# Patient Record
Sex: Male | Born: 1950 | Race: White | Hispanic: No | Smoking: Former smoker
Health system: Southern US, Community
[De-identification: ages and names within clinical notes are randomized; demographics above are authoritative.]

## PROBLEM LIST (undated history)

## (undated) DIAGNOSIS — I10 Essential (primary) hypertension: Secondary | ICD-10-CM

## (undated) DIAGNOSIS — I219 Acute myocardial infarction, unspecified: Secondary | ICD-10-CM

## (undated) DIAGNOSIS — E785 Hyperlipidemia, unspecified: Secondary | ICD-10-CM

## (undated) HISTORY — DX: Acute myocardial infarction, unspecified: I21.9

## (undated) HISTORY — DX: Essential (primary) hypertension: I10

## (undated) HISTORY — DX: Hyperlipidemia, unspecified: E78.5

---

## 2005-11-05 HISTORY — PX: OTHER SURGICAL HISTORY: SHX169

## 2006-11-05 HISTORY — PX: CORONARY STENT PLACEMENT: SHX1402

## 2007-11-06 HISTORY — PX: BACK SURGERY: SHX140

## 2009-07-27 ENCOUNTER — Ambulatory Visit: Payer: Self-pay | Admitting: Family Medicine

## 2010-03-21 ENCOUNTER — Ambulatory Visit: Payer: Self-pay | Admitting: Gastroenterology

## 2011-01-03 ENCOUNTER — Inpatient Hospital Stay: Payer: Self-pay | Admitting: Internal Medicine

## 2011-03-09 ENCOUNTER — Ambulatory Visit: Payer: Self-pay | Admitting: Internal Medicine

## 2012-07-21 ENCOUNTER — Ambulatory Visit: Payer: Self-pay | Admitting: Family Medicine

## 2013-05-11 ENCOUNTER — Ambulatory Visit: Payer: Self-pay | Admitting: Gastroenterology

## 2013-05-12 LAB — PATHOLOGY REPORT

## 2015-05-17 ENCOUNTER — Ambulatory Visit: Payer: Medicare Other | Attending: Pain Medicine | Admitting: Pain Medicine

## 2015-05-17 ENCOUNTER — Encounter: Payer: Self-pay | Admitting: Pain Medicine

## 2015-05-17 VITALS — BP 118/65 | HR 66 | Temp 97.5°F | Resp 16 | Ht 72.0 in | Wt 210.0 lb

## 2015-05-17 DIAGNOSIS — M79604 Pain in right leg: Secondary | ICD-10-CM | POA: Diagnosis present

## 2015-05-17 DIAGNOSIS — M5136 Other intervertebral disc degeneration, lumbar region: Secondary | ICD-10-CM | POA: Diagnosis not present

## 2015-05-17 DIAGNOSIS — M545 Low back pain: Secondary | ICD-10-CM | POA: Diagnosis present

## 2015-05-17 DIAGNOSIS — Z9889 Other specified postprocedural states: Secondary | ICD-10-CM | POA: Insufficient documentation

## 2015-05-17 DIAGNOSIS — M47816 Spondylosis without myelopathy or radiculopathy, lumbar region: Secondary | ICD-10-CM | POA: Insufficient documentation

## 2015-05-17 DIAGNOSIS — M79605 Pain in left leg: Secondary | ICD-10-CM | POA: Diagnosis present

## 2015-05-17 DIAGNOSIS — M533 Sacrococcygeal disorders, not elsewhere classified: Secondary | ICD-10-CM | POA: Insufficient documentation

## 2015-05-17 NOTE — Progress Notes (Signed)
Subjective:    Patient ID: Troy Woodward, male    DOB: 13-Jul-1951, 64 y.o.   MRN: [id number]  HPI patient is 64 year old gentleman who comes to pain management Center at the request of Dr. Joen Laura for further evaluation and treatment of pain involving the lower back and lower extremity regions. Patient is with history of lower back and lower extremity pain and has undergone prior surgical intervention of the lumbar region in [place] years ago. Patient states that his pain has been fairly well-controlled with present treatment regimen. An states that the pain does interfere with activities of daily living the significant degree at times. Patient states that his pain increases with walking and working. Patient states the pain is associated with weakness and interferes with patient's ability to obtain restful sleep. Regional pain involving the region of the upper back region and denies any trauma change in events of daily living the call significant change in symptomatology. We discussed patient's overall condition informed patient that we would review additional records and pending follow-up evaluation may consider patient for interventional treatment as well as modifications of other aspects of patient's treatment regimen including modification of medications. The patient was understanding and in agreement with suggested treatment plan.   Review of Systems Cardiovascular Prior heart attack  Pulmonary Unremarkable  Neurological Unremarkable  Psychological   unremarkable  Gastrointestinal Unremarkable  Genitourinary Unremarkable  Hematological Unremarkable  Endocrine Unremarkable  Rheumatological Unremarkable  Musculoskeletal Unremarkable  Other significant Unremarkable            Objective:   Physical Exam  There was tends to palpation of the splenius capitis and occipitalis musculature region of mild degree with moderate tends to palpation of the trapezius  musculature region on the right compared to the left. There was mild tenderness over the cervical facet cervical paraspinal musculature region and mild tenderness to palpation of the cervical facet cervical paraspinal musculature region on the right compared to the left. Palpation over the thoracic facet thoracic paraspinal musculature region was with tinged palpation as well with no crepitus of the thoracic region noted. Patient appeared to be with bilaterally equal grip strength. Tinel and Phalen's maneuver were without increase of pain of significant degree. There was tends to palpation over the thoracic facet thoracic paraspinal musculature region with no crepitus of the thoracic region noted. Palpation over the lumbar paraspinal musculature region lumbar facet region associated with increased pain with lateral bending and rotation and extension and palpation over the lumbar facets. There was tennis of the PSIS and PII S region. Palpation of the PSIS and PII S region with most significant on the right compared to the left. Straight leg raising was tolerates approximately 20 without a definite increase of pain with dorsiflexion noted. There was negative clonus negative Homans. DTRs were difficult to elicit patient had difficulty relaxing. Mild tenderness of the greater trochanteric region iliotibial band region. There was negative clonus negative Homans. Abdomen soft nontender with no costovertebral angle tenderness noted.      Assessment & Plan:   Degenerative disc disease lumbar spine Status post surgical intervention lumbar region  Lumbar facet syndrome  Sacroiliac joint dysfunction    Plan   Continue present medications. Patient will continue present medications at this time. We will request permission from Dr. Quillian Quince to prescribed medications for treatment of patient's pain. Pending review of additional records and decision of Dr. Quillian Quince we may begin prescribing medications for treatment of  patient's pain as explained to patient  on today's visit  F/U PCP Dr. Quillian QuinceBliss for evaliation of  BP and general medical  condition.  F/U surgical evaluation. We've discussed neurosurgical evaluation and will consider as explained to patient on today's visit  F/U neurological evaluation  MRI, CT scan, PNCV/EMG studies and other studies have been discussed and may be considered pending further assessment of patient's condition  May consider radiofrequency rhizolysis or intraspinal procedures pending response to present treatment and F/U evaluation.  Patient to call Pain Management Center should patient have concerns prior to scheduled return appointment.

## 2015-05-17 NOTE — Patient Instructions (Addendum)
Continue present medications. We will request permission from Dr. Quillian QuinceBliss to prescribed medications for treatment of your pain   F/U PCP for evaliation of  BP and general medical  condition.  F/U surgical evaluation  F/U Dr. Juliann Paresallwood as discussed  F/U neurological evaluation  May consider radiofrequency rhizolysis or intraspinal procedures pending response to present treatment and F/U evaluation.  Patient to call Pain Management Center should patient have concerns prior to scheduled return appointment.

## 2015-05-17 NOTE — Progress Notes (Signed)
Safety precautions to be maintained throughout the outpatient stay will include: orient to surroundings, keep bed in low position, maintain call bell within reach at all times, provide assistance with transfer out of bed and ambulation.  

## 2015-06-02 ENCOUNTER — Other Ambulatory Visit: Payer: Self-pay | Admitting: Pain Medicine

## 2015-06-14 ENCOUNTER — Encounter: Payer: Self-pay | Admitting: Pain Medicine

## 2015-06-14 ENCOUNTER — Ambulatory Visit: Payer: Medicare Other | Attending: Pain Medicine | Admitting: Pain Medicine

## 2015-06-14 VITALS — BP 111/71 | HR 69 | Temp 97.5°F | Resp 16 | Ht 72.0 in | Wt 215.0 lb

## 2015-06-14 DIAGNOSIS — M47816 Spondylosis without myelopathy or radiculopathy, lumbar region: Secondary | ICD-10-CM

## 2015-06-14 DIAGNOSIS — Z9889 Other specified postprocedural states: Secondary | ICD-10-CM | POA: Diagnosis not present

## 2015-06-14 DIAGNOSIS — M533 Sacrococcygeal disorders, not elsewhere classified: Secondary | ICD-10-CM | POA: Diagnosis not present

## 2015-06-14 DIAGNOSIS — M79605 Pain in left leg: Secondary | ICD-10-CM | POA: Diagnosis present

## 2015-06-14 DIAGNOSIS — M545 Low back pain: Secondary | ICD-10-CM | POA: Diagnosis present

## 2015-06-14 DIAGNOSIS — M5136 Other intervertebral disc degeneration, lumbar region: Secondary | ICD-10-CM | POA: Insufficient documentation

## 2015-06-14 DIAGNOSIS — M79604 Pain in right leg: Secondary | ICD-10-CM | POA: Diagnosis present

## 2015-06-14 NOTE — Patient Instructions (Addendum)
Continue present medications for now.  Lumbar epidural steroid injection to be performed at time of return appointment if Dr. Quillian Quince will allow you to stop your Plavix therapy prior to the lumbar epidural steroid injection  F/U PCP Dr. Quillian Quince for evaliation of  BP and general medical  Condition  X-ray of lumbar spine. Please ask Morrie Sheldon what day this week you will have x-ray of your lumbar spine  F/U surgical evaluation  F/U neurological evaluation  May consider radiofrequency rhizolysis or intraspinal procedures pending response to present treatment and F/U evaluation   Patient to call Pain Management Center should patient have concerns prior to scheduled return appointmen.  Stop Aspirin and Plavix 4 days before your procedure. Epidural Steroid Injection Patient Information  Description: The epidural space surrounds the nerves as they exit the spinal cord.  In some patients, the nerves can be compressed and inflamed by a bulging disc or a tight spinal canal (spinal stenosis).  By injecting steroids into the epidural space, we can bring irritated nerves into direct contact with a potentially helpful medication.  These steroids act directly on the irritated nerves and can reduce swelling and inflammation which often leads to decreased pain.  Epidural steroids may be injected anywhere along the spine and from the neck to the low back depending upon the location of your pain.   After numbing the skin with local anesthetic (like Novocaine), a small needle is passed into the epidural space slowly.  You may experience a sensation of pressure while this is being done.  The entire block usually last less than 10 minutes.  Conditions which may be treated by epidural steroids:   Low back and leg pain  Neck and arm pain  Spinal stenosis  Post-laminectomy syndrome  Herpes zoster (shingles) pain  Pain from compression fractures  Preparation for the injection:  1. Do not eat any solid food or  dairy products within 6 hours of your appointment.  2. You may drink clear liquids up to 2 hours before appointment.  Clear liquids include water, black coffee, juice or soda.  No milk or cream please. 3. You may take your regular medication, including pain medications, with a sip of water before your appointment  Diabetics should hold regular insulin (if taken separately) and take 1/2 normal NPH dos the morning of the procedure.  Carry some sugar containing items with you to your appointment. 4. A driver must accompany you and be prepared to drive you home after your procedure.  5. Bring all your current medications with your. 6. An IV may be inserted and sedation may be given at the discretion of the physician.   7. A blood pressure cuff, EKG and other monitors will often be applied during the procedure.  Some patients may need to have extra oxygen administered for a short period. 8. You will be asked to provide medical information, including your allergies, prior to the procedure.  We must know immediately if you are taking blood thinners (like Coumadin/Warfarin)  Or if you are allergic to IV iodine contrast (dye). We must know if you could possible be pregnant.  Possible side-effects:  Bleeding from needle site  Infection (rare, may require surgery)  Nerve injury (rare)  Numbness & tingling (temporary)  Difficulty urinating (rare, temporary)  Spinal headache ( a headache worse with upright posture)  Light -headedness (temporary)  Pain at injection site (several days)  Decreased blood pressure (temporary)  Weakness in arm/leg (temporary)  Pressure sensation in back/neck (temporary)  Call if you experience:  Fever/chills associated with headache or increased back/neck pain.  Headache worsened by an upright position.  New onset weakness or numbness of an extremity below the injection site  Hives or difficulty breathing (go to the emergency room)  Inflammation or drainage  at the infection site  Severe back/neck pain  Any new symptoms which are concerning to you  Please note:  Although the local anesthetic injected can often make your back or neck feel good for several hours after the injection, the pain will likely return.  It takes 3-7 days for steroids to work in the epidural space.  You may not notice any pain relief for at least that one week.  If effective, we will often do a series of three injections spaced 3-6 weeks apart to maximally decrease your pain.  After the initial series, we generally will wait several months before considering a repeat injection of the same type.  If you have any questions, please call [phone] [facility]

## 2015-06-14 NOTE — Progress Notes (Signed)
   Subjective:    Patient ID: Troy Woodward, male    DOB: 04-17-1951, 64 y.o.   MRN: [id number]  HPI  Patient is 64 year old gentleman who returns to pain management for further evaluation and treatment of pain involving the lower back and lower extremity regions. The patient is with prior surgical intervention of the lumbar region. Patient notes of return of pain involving the lower back and lower extremity regions which is aggravated by excessive standing and walking. Patient's pain radiating from the lumbar region to the lower extremity regions. We discussed additional treatment including interventional treatment and will consider patient for interventional treatment at time return appointment which time we will consider patient for lumbar epidural steroid injection. Patient denies any recent trauma or change in events of daily living the call significant change in symptomatology. We'll proceed with interventional treatment in attempt to decrease severity of patient's symptoms, minimize progression of symptoms, and avoid the need for more involved treatment. The patient was understanding and in agreement with suggested treatment plan.    Review of Systems     Objective:   Physical Exam  There was tends to palpation of the splenius capitis and occipitalis musculature regions. Palpation of these regions reproduced mild discomfort. There was tenderness over the cervical facet cervical paraspinal musculature region as well as the thoracic facet thoracic paraspinal musculature region of mild degree. There appeared to be unremarkable Spurling's maneuver. Tinel and Phalen's maneuver were without increase of pain of significant degree. Palpation over the region of the thoracic paraspinal muscles reproduced facet region was a tends to palpation of moderate degree and moderately severe degree of the lower thoracic region with evidence of moderately severe muscle spasm. Extension and palpation of the lumbar  facets reproduce moderately severe discomfort. Lateral bending and rotation associated with moderately severe discomfort. Straight leg raising limited to approximately 20 without increased pain with dorsiflexion noted. Reece. No definite sensory deficit of dermatomal distribution was detected. There was negative clonus negative Homans. Abdomen was nontender and no costovertebral angle tenderness was noted      Assessment & Plan:    Degenerative disc disease lumbar spine Status post surgical intervention lumbar region  Lumbar facet syndrome  Sacroiliac joint dysfunction    Plan   Continue present medications  Lumbar epidural steroid injection to be performed at time return appointment  F/U PCP Dr. Quillian Quince for evaliation of  BP and general medical  condition  F/U surgical evaluation as discussed   F/U neurological evaluation  X-ray of the lumbar spine to evaluate for degenerative changes of the lumbar spine and other abnormalities which may be contributing to patient's symptomatology  May consider radiofrequency rhizolysis or intraspinal procedures pending response to present treatment and F/U evaluation   Patient to call Pain Management Center should patient have concerns prior to scheduled return appointmen.

## 2015-06-15 ENCOUNTER — Ambulatory Visit
Admission: RE | Admit: 2015-06-15 | Discharge: 2015-06-15 | Disposition: A | Discharge status: Home / Self Care | Payer: Medicare Other | Source: Ambulatory Visit | Attending: Pain Medicine | Admitting: Pain Medicine

## 2015-06-15 DIAGNOSIS — Z9889 Other specified postprocedural states: Secondary | ICD-10-CM

## 2015-06-15 DIAGNOSIS — M533 Sacrococcygeal disorders, not elsewhere classified: Secondary | ICD-10-CM | POA: Insufficient documentation

## 2015-06-15 DIAGNOSIS — M5136 Other intervertebral disc degeneration, lumbar region: Secondary | ICD-10-CM

## 2015-06-15 DIAGNOSIS — Z981 Arthrodesis status: Secondary | ICD-10-CM | POA: Diagnosis not present

## 2015-06-15 DIAGNOSIS — M545 Low back pain: Secondary | ICD-10-CM | POA: Diagnosis present

## 2015-06-15 DIAGNOSIS — M47816 Spondylosis without myelopathy or radiculopathy, lumbar region: Secondary | ICD-10-CM

## 2015-06-20 ENCOUNTER — Ambulatory Visit: Payer: Medicare Other | Admitting: Pain Medicine

## 2015-06-20 ENCOUNTER — Other Ambulatory Visit
Admission: RE | Admit: 2015-06-20 | Discharge: 2015-06-20 | Disposition: A | Discharge status: Home / Self Care | Payer: Medicare Other | Source: Ambulatory Visit | Attending: Pain Medicine | Admitting: Pain Medicine

## 2015-06-20 VITALS — BP 111/86 | HR 59 | Temp 97.8°F | Resp 18 | Ht 72.0 in | Wt 212.0 lb

## 2015-06-20 DIAGNOSIS — M47816 Spondylosis without myelopathy or radiculopathy, lumbar region: Secondary | ICD-10-CM

## 2015-06-20 DIAGNOSIS — Z9889 Other specified postprocedural states: Secondary | ICD-10-CM | POA: Insufficient documentation

## 2015-06-20 DIAGNOSIS — M5136 Other intervertebral disc degeneration, lumbar region: Secondary | ICD-10-CM

## 2015-06-20 DIAGNOSIS — Z7902 Long term (current) use of antithrombotics/antiplatelets: Secondary | ICD-10-CM

## 2015-06-20 DIAGNOSIS — M533 Sacrococcygeal disorders, not elsewhere classified: Secondary | ICD-10-CM

## 2015-06-20 DIAGNOSIS — Z7982 Long term (current) use of aspirin: Secondary | ICD-10-CM

## 2015-06-20 LAB — APTT: APTT: 27 s (ref 24–36)

## 2015-06-20 LAB — PROTIME-INR
INR: 1.05
PROTHROMBIN TIME: 13.9 s (ref 11.4–15.0)

## 2015-06-20 MED ORDER — CEFUROXIME AXETIL 250 MG PO TABS: 250.0000 mg | ORAL_TABLET | Freq: Two times a day (BID) | ORAL | Status: DC

## 2015-06-20 MED ORDER — LIDOCAINE HCL (PF) 1 % IJ SOLN
INTRAMUSCULAR | Status: AC
  Administered 2015-06-20: 13:00:00
  Filled 2015-06-20: qty 5

## 2015-06-20 MED ORDER — SODIUM CHLORIDE 0.9 % IJ SOLN
INTRAMUSCULAR | Status: AC
  Administered 2015-06-20: 13:00:00
  Filled 2015-06-20: qty 20

## 2015-06-20 MED ORDER — TRIAMCINOLONE ACETONIDE 40 MG/ML IJ SUSP
INTRAMUSCULAR | Status: AC
  Administered 2015-06-20: 13:00:00
  Filled 2015-06-20: qty 1

## 2015-06-20 MED ORDER — CEFAZOLIN SODIUM 1 G IJ SOLR
INTRAMUSCULAR | Status: AC
  Administered 2015-06-20: 1 g via INTRAVENOUS
  Filled 2015-06-20: qty 10

## 2015-06-20 NOTE — Progress Notes (Signed)
Safety precautions to be maintained throughout the outpatient stay will include: orient to surroundings, keep bed in low position, maintain call bell within reach at all times, provide assistance with transfer out of bed and ambulation.  

## 2015-06-20 NOTE — Patient Instructions (Addendum)
Continue present medications and begin taking antibiotic Ceftin as prescribed. Please obtain your antibiotic today and begin taking antibiotic today  F/U PCP Dr. Clemmie Krill for evaliation of  BP and general medical  condition.  F/U surgical evaluation as discussed  F/U neurological evaluation.  May consider radiofrequency rhizolysis or intraspinal procedures pending response to present treatment and F/U evaluation.  Patient to call [facility] should patient have concerns prior to scheduled return appointment.  Pain Management Discharge Instructions  General Discharge Instructions :  If you need to reach your doctor call: Monday-Friday 8:00 am - 4:00 pm at [phone] or toll free [phone].  After clinic hours [phone] to have operator reach doctor.  Bring all of your medication bottles to all your appointments in the pain clinic.  To cancel or reschedule your appointment with Pain Management please remember to call 24 hours in advance to avoid a fee.  Refer to the educational materials which you have been given on: General Risks, I had my Procedure. Discharge Instructions, Post Sedation.  Post Procedure Instructions:  The drugs you were given will stay in your system until tomorrow, so for the next 24 hours you should not drive, make any legal decisions or drink any alcoholic beverages.  You may eat anything you prefer, but it is better to start with liquids then soups and crackers, and gradually work up to solid foods.  Please notify your doctor immediately if you have any unusual bleeding, trouble breathing or pain that is not related to your normal pain.  Depending on the type of procedure that was done, some parts of your body may feel week and/or numb.  This usually clears up by tonight or the next day.  Walk with the use of an assistive device or accompanied by an adult for the 24 hours.  You may use ice on the affected area for the first 24 hours.  Put ice  in a Ziploc bag and cover with a towel and place against area 15 minutes on 15 minutes off.  You may switch to heat after 24 hours.GENERAL RISKS AND COMPLICATIONS  What are the risk, side effects and possible complications? Generally speaking, most procedures are safe.  However, with any procedure there are risks, side effects, and the possibility of complications.  The risks and complications are dependent upon the sites that are lesioned, or the type of nerve block to be performed.  The closer the procedure is to the spine, the more serious the risks are.  Great care is taken when placing the radio frequency needles, block needles or lesioning probes, but sometimes complications can occur. 1. Infection: Any time there is an injection through the skin, there is a risk of infection.  This is why sterile conditions are used for these blocks.  There are four possible types of infection. 1. Localized skin infection. 2. Central Nervous System Infection-This can be in the form of Meningitis, which can be deadly. 3. Epidural Infections-This can be in the form of an epidural abscess, which can cause pressure inside of the spine, causing compression of the spinal cord with subsequent paralysis. This would require an emergency surgery to decompress, and there are no guarantees that the patient would recover from the paralysis. 4. Discitis-This is an infection of the intervertebral discs.  It occurs in about 1% of discography procedures.  It is difficult to treat and it may lead to surgery.        2. Pain: the needles have to go  through skin and soft tissues, will cause soreness.       3. Damage to internal structures:  The nerves to be lesioned may be near blood vessels or    other nerves which can be potentially damaged.       4. Bleeding: Bleeding is more common if the patient is taking blood thinners such as  aspirin, Coumadin, Ticiid, Plavix, etc., or if he/she have some genetic predisposition  such as  hemophilia. Bleeding into the spinal canal can cause compression of the spinal  cord with subsequent paralysis.  This would require an emergency surgery to  decompress and there are no guarantees that the patient would recover from the  paralysis.       5. Pneumothorax:  Puncturing of a lung is a possibility, every time a needle is introduced in  the area of the chest or upper back.  Pneumothorax refers to free air around the  collapsed lung(s), inside of the thoracic cavity (chest cavity).  Another two possible  complications related to a similar event would include: Hemothorax and Chylothorax.   These are variations of the Pneumothorax, where instead of air around the collapsed  lung(s), you may have blood or chyle, respectively.       6. Spinal headaches: They may occur with any procedures in the area of the spine.       7. Persistent CSF (Cerebro-Spinal Fluid) leakage: This is a rare problem, but may occur  with prolonged intrathecal or epidural catheters either due to the formation of a fistulous  track or a dural tear.       8. Nerve damage: By working so close to the spinal cord, there is always a possibility of  nerve damage, which could be as serious as a permanent spinal cord injury with  paralysis.       9. Death:  Although rare, severe deadly allergic reactions known as "Anaphylactic  reaction" can occur to any of the medications used.      10. Worsening of the symptoms:  We can always make thing worse.  What are the chances of something like this happening? Chances of any of this occuring are extremely low.  By statistics, you have more of a chance of getting killed in a motor vehicle accident: while driving to the hospital than any of the above occurring .  Nevertheless, you should be aware that they are possibilities.  In general, it is similar to taking a shower.  Everybody knows that you can slip, hit your head and get killed.  Does that mean that you should not shower again?  Nevertheless  always keep in mind that statistics do not mean anything if you happen to be on the wrong side of them.  Even if a procedure has a 1 (one) in a 1,000,000 (million) chance of going wrong, it you happen to be that one..Also, keep in mind that by statistics, you have more of a chance of having something go wrong when taking medications.  Who should not have this procedure? If you are on a blood thinning medication (e.g. Coumadin, Plavix, see list of "Blood Thinners"), or if you have an active infection going on, you should not have the procedure.  If you are taking any blood thinners, please inform your physician.  How should I prepare for this procedure?  Do not eat or drink anything at least six hours prior to the procedure.  Bring a driver with you .  It cannot be  a taxi.  Come accompanied by an adult that can drive you back, and that is strong enough to help you if your legs get weak or numb from the local anesthetic.  Take all of your medicines the morning of the procedure with just enough water to swallow them.  If you have diabetes, make sure that you are scheduled to have your procedure done first thing in the morning, whenever possible.  If you have diabetes, take only half of your insulin dose and notify our nurse that you have done so as soon as you arrive at the clinic.  If you are diabetic, but only take blood sugar pills (oral hypoglycemic), then do not take them on the morning of your procedure.  You may take them after you have had the procedure.  Do not take aspirin or any aspirin-containing medications, at least eleven (11) days prior to the procedure.  They may prolong bleeding.  Wear loose fitting clothing that may be easy to take off and that you would not mind if it got stained with Betadine or blood.  Do not wear any jewelry or perfume  Remove any nail coloring.  It will interfere with some of our monitoring equipment.  NOTE: Remember that this is not meant to be  interpreted as a complete list of all possible complications.  Unforeseen problems may occur.  BLOOD THINNERS The following drugs contain aspirin or other products, which can cause increased bleeding during surgery and should not be taken for 2 weeks prior to and 1 week after surgery.  If you should need take something for relief of minor pain, you may take acetaminophen which is found in Tylenol,m Datril, Anacin-3 and Panadol. It is not blood thinner. The products listed below are.  Do not take any of the products listed below in addition to any listed on your instruction sheet.  A.P.C or A.P.C with Codeine Codeine Phosphate Capsules #3 Ibuprofen Ridaura  ABC compound Congesprin Imuran rimadil  Advil Cope Indocin Robaxisal  Alka-Seltzer Effervescent Pain Reliever and Antacid Coricidin or Coricidin-D  Indomethacin Rufen  Alka-Seltzer plus Cold Medicine Cosprin Ketoprofen S-A-C Tablets  Anacin Analgesic Tablets or Capsules Coumadin Korlgesic Salflex  Anacin Extra Strength Analgesic tablets or capsules CP-2 Tablets Lanoril Salicylate  Anaprox Cuprimine Capsules Levenox Salocol  Anexsia-D Dalteparin Magan Salsalate  Anodynos Darvon compound Magnesium Salicylate Sine-off  Ansaid Dasin Capsules Magsal Sodium Salicylate  Anturane Depen Capsules Marnal Soma  APF Arthritis pain formula Dewitt's Pills Measurin Stanback  Argesic Dia-Gesic Meclofenamic Sulfinpyrazone  Arthritis Bayer Timed Release Aspirin Diclofenac Meclomen Sulindac  Arthritis pain formula Anacin Dicumarol Medipren Supac  Analgesic (Safety coated) Arthralgen Diffunasal Mefanamic Suprofen  Arthritis Strength Bufferin Dihydrocodeine Mepro Compound Suprol  Arthropan liquid Dopirydamole Methcarbomol with Aspirin Synalgos  ASA tablets/Enseals Disalcid Micrainin Tagament  Ascriptin Doan's Midol Talwin  Ascriptin A/D Dolene Mobidin Tanderil  Ascriptin Extra Strength Dolobid Moblgesic Ticlid  Ascriptin with Codeine Doloprin or Doloprin  with Codeine Momentum Tolectin  Asperbuf Duoprin Mono-gesic Trendar  Aspergum Duradyne Motrin or Motrin IB Triminicin  Aspirin plain, buffered or enteric coated Durasal Myochrisine Trigesic  Aspirin Suppositories Easprin Nalfon Trillsate  Aspirin with Codeine Ecotrin Regular or Extra Strength Naprosyn Uracel  Atromid-S Efficin Naproxen Ursinus  Auranofin Capsules Elmiron Neocylate Vanquish  Axotal Emagrin Norgesic Verin  Azathioprine Empirin or Empirin with Codeine Normiflo Vitamin E  Azolid Emprazil Nuprin Voltaren  Bayer Aspirin plain, buffered or children's or timed BC Tablets or powders Encaprin Orgaran Warfarin Sodium  Buff-a-Comp Enoxaparin  Orudis Zorpin  Buff-a-Comp with Codeine Equegesic Os-Cal-Gesic   Buffaprin Excedrin plain, buffered or Extra Strength Oxalid   Bufferin Arthritis Strength Feldene Oxphenbutazone   Bufferin plain or Extra Strength Feldene Capsules Oxycodone with Aspirin   Bufferin with Codeine Fenoprofen Fenoprofen Pabalate or Pabalate-SF   Buffets II Flogesic Panagesic   Buffinol plain or Extra Strength Florinal or Florinal with Codeine Panwarfarin   Buf-Tabs Flurbiprofen Penicillamine   Butalbital Compound Four-way cold tablets Penicillin   Butazolidin Fragmin Pepto-Bismol   Carbenicillin Geminisyn Percodan   Carna Arthritis Reliever Geopen Persantine   Carprofen Gold's salt Persistin   Chloramphenicol Goody's Phenylbutazone   Chloromycetin Haltrain Piroxlcam   Clmetidine heparin Plaquenil   Cllnoril Hyco-pap Ponstel   Clofibrate Hydroxy chloroquine Propoxyphen         Before stopping any of these medications, be sure to consult the physician who ordered them.  Some, such as Coumadin (Warfarin) are ordered to prevent or treat serious conditions such as "deep thrombosis", "pumonary embolisms", and other heart problems.  The amount of time that you may need off of the medication may also vary with the medication and the reason for which you were taking it.   If you are taking any of these medications, please make sure you notify your pain physician before you undergo any procedures.         Oxygen per nasal cannula 1-5- liters per minute until oxygen saturation is >90% on room air without respiratory distress. Diet as tolerated. IV: Discontinue when stable. Activity: As tolerated. Discharge when criteria are met.  May drive home

## 2015-06-20 NOTE — Progress Notes (Signed)
   Subjective:    Patient ID: Troy Woodward, male    DOB: [birth year], 64 y.o.   MRN: [id number]  HPI    Review of Systems     Objective:   Physical Exam        Assessment & Plan:  .p

## 2015-06-20 NOTE — Progress Notes (Signed)
   Subjective:    Patient ID: Troy Woodward, male    DOB: [birth year], 64 y.o.   MRN: [id number]  HPI  PROCEDURE PERFORMED: Lumbar epidural steroid injection   NOTE: The patient is a 64 y.o. male who returns to [facility] for further evaluation and treatment of pain involving the lumbar and lower extremity region.   Prior studies revealed the patient to be with degenerative disc disease lumbar spine status post surgical intervention lumbar region. The risks, benefits, and expectations of the procedure have been discussed and explained to the patient who was understanding and in agreement with suggested treatment plan. We will proceed with lumbar epidural steroid injection as discussed and as explained to the patient who is willing to proceed with procedure as planned.   DESCRIPTION OF PROCEDURE: Lumbar epidural steroid injection with  EKG,, blood pressure, pulse, and pulse oximetry monitoring. The procedure was performed with the patient in the prone position under fluoroscopic guidance. A local anesthetic skin wheal of 1.5% plain lidocaine was accomplished at proposed entry site. An 18-gauge Tuohy epidural needle was inserted at the L  1 vertebral body level  right of the midline via loss-of-resistance technique with negative heme and negative CSF return. A total of 4 mL of Preservative-Free normal saline with 40 mg of Kenalog injected incrementally via epidurally placed needle. Needle was removed.    A total of 40 mg of Kenalog was utilized for the procedure.   The patient tolerated the injection well.    PLAN:   1. Medications: We will continue presently prescribed medications. 2. Will consider modification of treatment regimen pending response to treatment rendered on today's visit and follow-up evaluation. 3. The patient is to follow-up with primary care physician Dr. Quillian Quince regarding blood pressure and general medical condition status post lumbar epidural steroid injection  performed on today's visit. 4. Surgical evaluation has been addressed 5. Neurological evaluation to be considered as discussed 6. The patient may be a candidate for radiofrequency procedures, implantation device, and other treatment pending response to treatment and follow-up evaluation. 7. The patient has been advised to adhere to proper body mechanics and avoid activities which appear to aggravate condition. 8. The patient has been advised to call the Pain Management Center prior to scheduled return appointment should there be significant change in condition or should there be sign  The patient is understanding and agrees with the suggested  treatment plan   Review of Systems     Objective:   Physical Exam        Assessment & Plan:

## 2015-06-21 ENCOUNTER — Telehealth: Payer: Self-pay | Admitting: *Deleted

## 2015-06-21 NOTE — Telephone Encounter (Signed)
Left voice mail

## 2015-07-19 ENCOUNTER — Ambulatory Visit: Payer: Medicare Other | Attending: Pain Medicine | Admitting: Pain Medicine

## 2015-07-19 ENCOUNTER — Encounter: Payer: Self-pay | Admitting: Pain Medicine

## 2015-07-19 VITALS — BP 126/78 | HR 96 | Temp 97.6°F | Resp 18 | Ht 72.0 in | Wt 215.0 lb

## 2015-07-19 DIAGNOSIS — M5481 Occipital neuralgia: Secondary | ICD-10-CM

## 2015-07-19 DIAGNOSIS — M5030 Other cervical disc degeneration, unspecified cervical region: Secondary | ICD-10-CM | POA: Diagnosis not present

## 2015-07-19 DIAGNOSIS — Z9889 Other specified postprocedural states: Secondary | ICD-10-CM | POA: Diagnosis not present

## 2015-07-19 DIAGNOSIS — R51 Headache: Secondary | ICD-10-CM | POA: Diagnosis present

## 2015-07-19 DIAGNOSIS — M5136 Other intervertebral disc degeneration, lumbar region: Secondary | ICD-10-CM

## 2015-07-19 DIAGNOSIS — M47816 Spondylosis without myelopathy or radiculopathy, lumbar region: Secondary | ICD-10-CM

## 2015-07-19 DIAGNOSIS — M47812 Spondylosis without myelopathy or radiculopathy, cervical region: Secondary | ICD-10-CM | POA: Insufficient documentation

## 2015-07-19 DIAGNOSIS — M542 Cervicalgia: Secondary | ICD-10-CM | POA: Diagnosis present

## 2015-07-19 DIAGNOSIS — M79602 Pain in left arm: Secondary | ICD-10-CM | POA: Diagnosis present

## 2015-07-19 DIAGNOSIS — M533 Sacrococcygeal disorders, not elsewhere classified: Secondary | ICD-10-CM | POA: Insufficient documentation

## 2015-07-19 MED ORDER — HYDROCODONE-ACETAMINOPHEN 5-325 MG PO TABS: ORAL_TABLET | ORAL | Status: DC

## 2015-07-19 NOTE — Progress Notes (Signed)
Spoke with Belenda Cruise from Dr. Aggie Cosier office. Last prescribed-- Hydrocodone 5/325 mg 1 three times per day. #90 on 06-21-15 Meloxicam 15 mg, one per day, #30 on 06-21-15

## 2015-07-19 NOTE — Progress Notes (Signed)
   Subjective:    Patient ID: Troy Woodward, male    DOB: 05-19-1951, 64 y.o.   MRN: [id number]  HPI  Patient is 64 year old gentleman returns to pain management for further evaluation and treatment of pain involving the neck headaches and upper extremity region as well as the lower back and lower extremity region. Patient's has significant improvement of the lower back lower extremity pain following interventional treatment performed in [facility]. Patient states present time he has pain occurring in the region of the neck associated with stiffness and spasms of the neck. Patient denies trauma change in events of daily living the cause change in symptomatology. We discussed patient's condition we will proceed with greater occipital nerve block at time return appointment in attempt to decrease headaches as well as decreased pain of the neck and surrounding muscle matures spasms. Patient was understanding and agreed suggested treatment plan. We will also refill patient's hydrocodone acetaminophen after we verified patient's medication dosage with Dr. Quillian Quince on today's visit. The patient was in agreement with suggested treatment plan.    Review of Systems     Objective:   Physical Exam There was tennis over the splenius capitate and occipitalis region of moderate degree. There was moderate tennis over the cervical facet cervical paraspinal musculature region. There appeared to be unremarkable Spurling's maneuver. Patient appeared to be with slightly decreased grip strength. Tinel and Phalen's maneuver were without increase of pain significantly. Palpation of the acromioclavicular glenohumeral joint region reproduces moderate discomfort. There was tenderness over the thoracic facet thoracic paraspinal musculature region without crepitus of the thoracic region noted. Palpation of the splenius capitis and occipitalis musculature region reproduced predominant portion of patient's pain there was  tenderness over the lumbar facet lumbar paraspinal muscles region of mild degree. Lateral bending and rotation and extension to palpation of the lumbar facets reproduce mild to mild to moderate discomfort. There was tenderness over the PSIS PII S region as well as the gluteal and piriformis musculature region of mild to moderate degree. There was mild tinnitus of the greater trochanteric region iliotibial band region. Straight leg raising was tolerated to possibly 20 without increased pain with dorsiflexion noted. There appeared to be negative clonus negative Homans. No definite sensory deficit of dermatomal distribution detected. DTRs difficult to elicit patient had difficulty relaxing. Abdomen nontender no costovertebral tenderness noted.      Assessment & Plan:    Degenerative disc disease cervical spine  Bilateral occipital neuralgia  Cervical facet syndrome  Degenerative disc disease lumbar spine Status post surgical intervention lumbar region  Lumbar facet syndrome  Sacroiliac joint dysfunction    PLAN   Continue present medication. Patient was given prescription for hydrocodone acetaminophen  5/325. Patient is limit 3 pills per day. Patient was given quantity of 90 pills  Greater occipital nerve block to be performed at time return appointment  F/U PCP Dr. Quillian Quince for evaliation of  BP and general medical  condition  F/U surgical evaluation. May consider pending follow-up evaluations  F/U neurological evaluation. May consider pending follow-up evaluations  May consider radiofrequency rhizolysis or intraspinal procedures pending response to present treatment and F/U evaluation   Patient to call Pain Management Center should patient have concerns prior to scheduled return appointment.

## 2015-07-19 NOTE — Patient Instructions (Addendum)
PLAN   Continue present medication   Greater occipital nerve block to be performed at time return appointment  F/U PCP Dr. Quillian Quince for evaliation of  BP and general medical  condition  F/U surgical evaluation. May consider pending follow-up evaluations  F/U neurological evaluation. May consider pending follow-up evaluations  May consider radiofrequency rhizolysis or intraspinal procedures pending response to present treatment and F/U evaluation   Patient to call Pain Management Center should patient have concerns prior to scheduled return appointment. Occipital Nerve Block Patient Information  Description: The occipital nerves originate in the cervical (neck) spinal cord and travel upward through muscle and tissue to supply sensation to the back of the head and top of the scalp.  In addition, the nerves control some of the muscles of the scalp.  Occipital neuralgia is an irritation of these nerves which can cause headaches, numbness of the scalp, and neck discomfort.     The occipital nerve block will interrupt nerve transmission through these nerves and can relieve pain and spasm.  The block consists of insertion of a small needle under the skin in the back of the head to deposit local anesthetic (numbing medicine) and/or steroids around the nerve.  The entire block usually lasts less than 5 minutes.  Conditions which may be treated by occipital blocks:   Muscular pain and spasm of the scalp  Nerve irritation, back of the head  Headaches  Upper neck pain  Preparation for the injection:  1. Do not eat any solid food or dairy products within 6 hours of your appointment. 2. You may drink clear liquids up to 2 hours before appointment.  Clear liquids include water, black coffee, juice or soda.  No milk or cream please. 3. You may take your regular medication, including pain medications, with a sip of water before you appointment.  Diabetics should hold regular insulin (if taken  separately) and take 1/2 normal NPH dose the morning of the procedure.  Carry some sugar containing items with you to your appointment. 4. A driver must accompany you and be prepared to drive you home after your procedure. 5. Bring all your current medications with you. 6. An IV may be inserted and sedation may be given at the discretion of the physician. 7. A blood pressure cuff, EKG, and other monitors will often be applied during the procedure.  Some patients may need to have extra oxygen administered for a short period. 8. You will be asked to provide medical information, including your allergies and medications, prior to the procedure.  We must know immediately if you are taking blood thinners (like Coumadin/Warfarin) or if you are allergic to IV iodine contrast (dye).  We must know if you could possible be pregnant.  9. Do not wear a high collared shirt or turtleneck.  Tie long hair up in the back if possible.  Possible side-effects:   Bleeding from needle site  Infection (rare, may require surgery)  Nerve injury (rare)  Hair on back of neck can be tinged with iodine scrub (this will wash out)  Light-headedness (temporary)  Pain at injection site (several days)  Decreased blood pressure (rare, temporary)  Seizure (very rare)  Call if you experience:   Hives or difficulty breathing ( go to the emergency room)  Inflammation or drainage at the injection site(s)  Please note:  Although the local anesthetic injected can often make your painful muscles or headache feel good for several hours after the injection, the pain may return.  It takes 3-7 days for steroids to work.  You may not notice any pain relief for at least one week.  If effective, we will often do a series of injections spaced 3-6 weeks apart to maximally decrease your pain.  If you have any questions, please call [phone] [facility]

## 2015-07-19 NOTE — Progress Notes (Signed)
.  pms3.

## 2015-08-01 ENCOUNTER — Ambulatory Visit: Payer: Medicare Other | Attending: Pain Medicine | Admitting: Pain Medicine

## 2015-08-01 ENCOUNTER — Encounter: Payer: Self-pay | Admitting: Pain Medicine

## 2015-08-01 VITALS — BP 114/62 | HR 53 | Temp 98.7°F | Resp 16 | Ht 72.0 in | Wt 215.0 lb

## 2015-08-01 DIAGNOSIS — M542 Cervicalgia: Secondary | ICD-10-CM | POA: Insufficient documentation

## 2015-08-01 DIAGNOSIS — R51 Headache: Secondary | ICD-10-CM | POA: Diagnosis present

## 2015-08-01 DIAGNOSIS — M5481 Occipital neuralgia: Secondary | ICD-10-CM

## 2015-08-01 DIAGNOSIS — M47816 Spondylosis without myelopathy or radiculopathy, lumbar region: Secondary | ICD-10-CM

## 2015-08-01 DIAGNOSIS — M5030 Other cervical disc degeneration, unspecified cervical region: Secondary | ICD-10-CM

## 2015-08-01 DIAGNOSIS — M47812 Spondylosis without myelopathy or radiculopathy, cervical region: Secondary | ICD-10-CM

## 2015-08-01 DIAGNOSIS — M533 Sacrococcygeal disorders, not elsewhere classified: Secondary | ICD-10-CM

## 2015-08-01 DIAGNOSIS — M5136 Other intervertebral disc degeneration, lumbar region: Secondary | ICD-10-CM

## 2015-08-01 DIAGNOSIS — Z9889 Other specified postprocedural states: Secondary | ICD-10-CM

## 2015-08-01 MED ORDER — ORPHENADRINE CITRATE 30 MG/ML IJ SOLN
INTRAMUSCULAR | Status: AC
  Filled 2015-08-01: qty 2

## 2015-08-01 MED ORDER — BUPIVACAINE HCL (PF) 0.25 % IJ SOLN
INTRAMUSCULAR | Status: AC
  Administered 2015-08-01: 11:00:00
  Filled 2015-08-01: qty 30

## 2015-08-01 MED ORDER — TRIAMCINOLONE ACETONIDE 40 MG/ML IJ SUSP
INTRAMUSCULAR | Status: AC
  Administered 2015-08-01: 11:00:00
  Filled 2015-08-01: qty 1

## 2015-08-01 NOTE — Progress Notes (Signed)
premenstrual tension syndrome  

## 2015-08-01 NOTE — Progress Notes (Signed)
Safety precautions to be maintained throughout the outpatient stay will include: orient to surroundings, keep bed in low position, maintain call bell within reach at all times, provide assistance with transfer out of bed and ambulation.  

## 2015-08-01 NOTE — Progress Notes (Signed)
   Subjective:    Patient ID: Troy Woodward, male    DOB: 02/01/1951, 64 y.o.   MRN: [id number]  HPI  NOTE: The patient is a 64 y.o.-year-old male who returns to the [facility] for further evaluation and treatment of pain consisting of pain involving the region of the neck and headache.  Patient is with history of pain involving the neck with stiffness of the neck and headaches. There is concern regarding significant component of patient's headaches due to greater occipital neuralgia myofascial pain related headaches .  The risks, benefits, and expectations of the procedure have been discussed and explained to patient, who is understanding and wishes to proceed with interventional treatment as discussed and as explained to patient.  Will proceed with greater occipital nerve blocks with myoneural block injections at this time as discussed and as explained to patient.  All are understanding and in agreement with suggested treatment plan.    PROCEDURE:  Greater occipital nerve block on the left side with EKG, blood pressure, pulse, pulse oximetry monitoring.  Procedure performed with patient in prone position.  Greater occipital nerve block on the left side.   With patient in prone position, Betadine prep of proposed entry site accomplished.  Following identification of the nuchal ridge, 22 -gauge needle was inserted at the level of the nuchal ridge medial to the occipital artery.  Following negative aspiration, 4cc 0.25% bupivacaine with Kenalog injected for left greater occipital nerve block.  Needle was removed.  Patient tolerated injection well.   Greater occipital nerve block on the rightt side. The greater occipital nerve block on the right side was performed exactly as the left greater occipital nerve block was performed and utilizing the same technique.  Myoneural block injections of the cervical musculature region Following alcohol prep of proposed entry site a 22-gauge needle was  inserted in the cervical musculature region and following negative aspiration 2 cc of 0.25% bupivacaine with Norflex was injected for myoneural block injections 4   A total of 10 mg Kenalog was utilized for the entire procedure.  PLAN:    1. Medications: Will continue presently prescribed medication hydrocodone acetaminophen at this time. 2. Patient to follow up with primary care physician Dr. Quillian Quince for evaluation of blood pressure and general medical condition status post procedure performed on today's visit. 3. Neurological evaluation for further assessment of headaches for further studies as discussed. 4. Surgical evaluation as discussed.  5. Patient may be candidate for Botox injections, radiofrequency procedures, as well as implantation type procedures pending response to treatment rendered on today's visit and pending follow-up evaluation. 6. Patient has been advised to adhere to proper body mechanics and to avoid activities which appear to aggravate condition.cations:  Will continue presently prescribed medications at this time. 7. The patient is understanding and in agreement with the suggested treatment plan.   Review of Systems     Objective:   Physical Exam        Assessment & Plan:

## 2015-08-01 NOTE — Patient Instructions (Addendum)
PLAN   Continue present medication hydrocodone acetaminophen   F/U PCP Dr. Quillian Quince for evaliation of  BP and general medical  condition  F/U surgical evaluation. May consider pending follow-up evaluations  F/U neurological evaluation. May consider pending follow-up evaluations  May consider radiofrequency rhizolysis or intraspinal procedures pending response to present treatment and F/U evaluation   Patient to call [facility] should patient have concerns prior to scheduled return appointment.  Pain Management Discharge Instructions  General Discharge Instructions :  If you need to reach your doctor call: Monday-Friday 8:00 am - 4:00 pm at [phone] or toll free [phone].  After clinic hours [phone] to have operator reach doctor.  Bring all of your medication bottles to all your appointments in the pain clinic.  To cancel or reschedule your appointment with Pain Management please remember to call 24 hours in advance to avoid a fee.  Refer to the educational materials which you have been given on: General Risks, I had my Procedure. Discharge Instructions, Post Sedation.  Post Procedure Instructions:  Please notify your doctor immediately if you have any unusual bleeding, trouble breathing or pain that is not related to your normal pain.  Depending on the type of procedure that was done, some parts of your body may feel week and/or numb.  This usually clears up by tonight or the next day.  Walk with the use of an assistive device or accompanied by an adult for the 24 hours.  You may use ice on the affected area for the first 24 hours.  Put ice in a Ziploc bag and cover with a towel and place against area 15 minutes on 15 minutes off.  You may switch to heat after 24 hours.

## 2015-08-02 ENCOUNTER — Telehealth: Payer: Self-pay | Admitting: *Deleted

## 2015-08-02 NOTE — Telephone Encounter (Signed)
Patient denies problems post procedure. 

## 2015-08-17 ENCOUNTER — Ambulatory Visit: Payer: Medicare Other | Attending: Pain Medicine | Admitting: Pain Medicine

## 2015-08-17 ENCOUNTER — Encounter: Payer: Self-pay | Admitting: Pain Medicine

## 2015-08-17 VITALS — BP 120/65 | HR 57 | Temp 97.7°F | Resp 15 | Ht 72.0 in | Wt 215.0 lb

## 2015-08-17 DIAGNOSIS — M542 Cervicalgia: Secondary | ICD-10-CM | POA: Diagnosis present

## 2015-08-17 DIAGNOSIS — Z9889 Other specified postprocedural states: Secondary | ICD-10-CM

## 2015-08-17 DIAGNOSIS — M5481 Occipital neuralgia: Secondary | ICD-10-CM | POA: Diagnosis not present

## 2015-08-17 DIAGNOSIS — M533 Sacrococcygeal disorders, not elsewhere classified: Secondary | ICD-10-CM

## 2015-08-17 DIAGNOSIS — M51369 Other intervertebral disc degeneration, lumbar region without mention of lumbar back pain or lower extremity pain: Secondary | ICD-10-CM

## 2015-08-17 DIAGNOSIS — M545 Low back pain: Secondary | ICD-10-CM | POA: Diagnosis present

## 2015-08-17 DIAGNOSIS — M5030 Other cervical disc degeneration, unspecified cervical region: Secondary | ICD-10-CM | POA: Diagnosis not present

## 2015-08-17 DIAGNOSIS — M47816 Spondylosis without myelopathy or radiculopathy, lumbar region: Secondary | ICD-10-CM

## 2015-08-17 DIAGNOSIS — M5136 Other intervertebral disc degeneration, lumbar region: Secondary | ICD-10-CM

## 2015-08-17 DIAGNOSIS — R51 Headache: Secondary | ICD-10-CM | POA: Diagnosis present

## 2015-08-17 DIAGNOSIS — M47812 Spondylosis without myelopathy or radiculopathy, cervical region: Secondary | ICD-10-CM

## 2015-08-17 MED ORDER — HYDROCODONE-ACETAMINOPHEN 5-325 MG PO TABS: ORAL_TABLET | ORAL | Status: DC

## 2015-08-17 MED ORDER — GABAPENTIN 300 MG PO CAPS
ORAL_CAPSULE | ORAL | Status: DC
Start: 2015-08-17 — End: 2015-09-15

## 2015-08-17 NOTE — Progress Notes (Signed)
   Subjective:    Patient ID: Troy Woodward, male    DOB: 03/21/1951, 64 y.o.   MRN: [id number]  HPI Patient is 64 year old gentleman who returns to [facility] for further evaluation and treatment of pain involving the neck upper extremity regions with headache as well as pain of the lower back and lower extremity region. The patient missed improvement of pain with prior treatment and [facility]. Patient states that he continues to benefit from prior interventional treatment performed in [facility]. We discussed patient's medications and will continue hydrocodone acetaminophen and will begin Neurontin. Cautioned patient regarding respiratory depression confusion excessive sedation and other side effects which can occur with Neurontin and hydrocodone acetaminophen. The patient acknowledged understanding and will exercise TO avoid undesirable side effects we will avoid interventional treatment at this time and hip response to hydrocodone acetaminophen and the addition of Neurontin to patient's treatment regimen      Review of Systems     Objective:   Physical Exam  There was tenderness of the splenius capitis Cipro talus region of mild degree. There was mild tenderness of the cervical facet cervical paraspinal musculature region. There was unremarkable Spurling's maneuver and patient appeared to be with bilaterally equal grip strength. Tinel and Phalen's maneuver were without increase of pain of significant degree. There was tends to palpation over the region of the cervical facet cervical paraspinal musculature and thoracic facet thoracic paraspinal muscles region a moderate degree. Palpation over the thoracic facet thoracic paraspinal musculature region was without evidence of crepitus of the thoracic region. Palpation over the lumbar paraspinal muscles lumbar facet region associated with moderate discomfort. Lateral bending and rotation and extension and  palpation of the lumbar facets reproduce moderate discomfort. There was moderate tenderness over the PSIS PII S region as well as the gluteal and piriformis musculature region. There was mild tenderness of the greater trochanteric region iliotibial band region straight leg raising tolerates approximately 20 without increased pain with dorsiflexion noted. EHL strength appeared to be equal with no sensory deficit of dermatomal distribution detectedThere was negative clonus negative Homans       Assessment & Plan:    Degenerative disc disease lumbar spine Status post surgical intervention lumbar region  Lumbar facet syndrome  Sacroiliac joint dysfunction  Bilateral occipital neuralgia  Degenerative disc disease cervical spine  Cervical facet syndrome   PLAN   Continue present medication hydrocodone acetaminophen and begin Neurontin . Patient was cautioned regarding respiratory depression confusion excessive sedation and other side effects which can occur with medications  F/U PCP Dr. Quillian QuinceBliss for evaliation of  BP and general medical  condition  F/U surgical evaluation. May consider pending follow-up evaluations  F/U neurological evaluation. May consider pending follow-up evaluations  May consider radiofrequency rhizolysis or intraspinal procedures pending response to present treatment and F/U evaluation   Patient to call [facility] Center should patient have concerns prior to scheduled return appointment.

## 2015-08-17 NOTE — Patient Instructions (Addendum)
PLAN   Continue present medication hydrocodone acetaminophen and begin Neurontin. Cautioned Neurontin can cause respiratory depression excessive sedation confusion and other side effects  F/U PCP Dr. Quillian QuinceBliss for evaliation of  BP and general medical  condition  F/U surgical evaluation. May consider pending follow-up evaluations  F/U neurological evaluation. May consider pending follow-up evaluations  May consider radiofrequency rhizolysis or intraspinal procedures pending response to present treatment and F/U evaluation   Patient to call [facility] should patient have concerns prior to scheduled return appointment.

## 2015-08-17 NOTE — Progress Notes (Signed)
Safety precautions to be maintained throughout the outpatient stay will include: orient to surroundings, keep bed in low position, maintain call bell within reach at all times, provide assistance with transfer out of bed and ambulation.  

## 2015-08-30 ENCOUNTER — Ambulatory Visit: Payer: Medicare Other | Admitting: Pain Medicine

## 2015-09-06 ENCOUNTER — Other Ambulatory Visit: Payer: Self-pay | Admitting: Pain Medicine

## 2015-09-15 ENCOUNTER — Encounter: Payer: Self-pay | Admitting: Pain Medicine

## 2015-09-15 ENCOUNTER — Ambulatory Visit: Payer: Medicare Other | Attending: Pain Medicine | Admitting: Pain Medicine

## 2015-09-15 VITALS — BP 118/87 | HR 61 | Temp 97.9°F | Resp 18 | Ht 72.0 in | Wt 215.0 lb

## 2015-09-15 DIAGNOSIS — M47816 Spondylosis without myelopathy or radiculopathy, lumbar region: Secondary | ICD-10-CM

## 2015-09-15 DIAGNOSIS — Z9889 Other specified postprocedural states: Secondary | ICD-10-CM | POA: Insufficient documentation

## 2015-09-15 DIAGNOSIS — M5481 Occipital neuralgia: Secondary | ICD-10-CM | POA: Insufficient documentation

## 2015-09-15 DIAGNOSIS — R51 Headache: Secondary | ICD-10-CM | POA: Diagnosis present

## 2015-09-15 DIAGNOSIS — M533 Sacrococcygeal disorders, not elsewhere classified: Secondary | ICD-10-CM | POA: Diagnosis not present

## 2015-09-15 DIAGNOSIS — M5136 Other intervertebral disc degeneration, lumbar region: Secondary | ICD-10-CM | POA: Diagnosis not present

## 2015-09-15 DIAGNOSIS — M5030 Other cervical disc degeneration, unspecified cervical region: Secondary | ICD-10-CM | POA: Diagnosis not present

## 2015-09-15 DIAGNOSIS — M542 Cervicalgia: Secondary | ICD-10-CM | POA: Diagnosis present

## 2015-09-15 DIAGNOSIS — M47812 Spondylosis without myelopathy or radiculopathy, cervical region: Secondary | ICD-10-CM

## 2015-09-15 MED ORDER — HYDROCODONE-ACETAMINOPHEN 5-325 MG PO TABS: ORAL_TABLET | ORAL | Status: DC

## 2015-09-15 MED ORDER — GABAPENTIN 300 MG PO CAPS: ORAL_CAPSULE | ORAL | Status: DC

## 2015-09-15 NOTE — Progress Notes (Signed)
Safety precautions to be maintained throughout the outpatient stay will include: orient to surroundings, keep bed in low position, maintain call bell within reach at all times, provide assistance with transfer out of bed and ambulation.  

## 2015-09-15 NOTE — Patient Instructions (Signed)
PLAN   Continue present medication hydrocodone acetaminophen and Neurontin.  F/U PCP Dr. Quillian QuinceBliss for evaliation of  BP and general medical  condition  F/U surgical evaluation. May consider pending follow-up evaluations  F/U neurological evaluation. May consider pending follow-up evaluations  May consider radiofrequency rhizolysis or intraspinal procedures pending response to present treatment and F/U evaluation   Patient to call [facility] should patient have concerns prior to scheduled return appointment.

## 2015-09-15 NOTE — Progress Notes (Signed)
   Subjective:    Patient ID: Troy Woodward, male    DOB: 04/15/1951, 64 y.o.   MRN: [id number]  HPI  The patient is a 64 year old gentleman who returns to pain management Center for further evaluation and treatment of pain involving the neck headaches upper extremities lower back and lower extremity region. Patient states the pain is fairly well-controlled at this time. The patient does admit to cramping of the legs at night. We have asked patient to follow up with Dr. Cresenciano LickPlace address his lower extremity cramping and to consider obtaining evaluation of his electrolytes and other laboratory studies. We have also discussed medications as well as other dietary supplements which may be beneficial in terms of reducing patient's leg cramping. This present time we will continue patient's Neurontin and hydrocodone acetaminophen which has helped significantly in terms of improving patient's pain of the lumbar lower extremity region and cervical epidural extremity region as well as headaches. The patient agreed to suggested treatment plan.       Review of Systems     Objective:   Physical Exam  There was mild tenderness to palpation over the splenius capitis thalamus musculature region. There is minimal tenderness of the acromioclavicular and glenohumeral joint regions. Patient appeared to be bilaterally equal grip strength and Tinel and Phalen's and had increased pain significantly. There was unremarkable Spurling's maneuver There was mild tenderness of the lumbar facet lumbar paraspinal musculature region. The patient tolerated to 30 without increased pain with dorsiflexion noted. No sensory deficit or dermatomal distribution at the lower extremities noted. Palpation over the lumbar facets lumbar paraspinal musculature reproduces mild discomfort. There is mild tenderness to palpation of the PSIS  And P IIS regions. No significant history palpation was noted in the region of the greater trochanteric region  on iliotibial band region that was negative clonus negative Homans. Abdomen nontender with no costovertebral length and is noted        Assessment & Plan:      Degenerative disc disease cervical spine  Bilateral occipital neuralgia  Cervical facet syndrome  Degenerative disc disease lumbar spine Status post surgical intervention lumbar region  Lumbar facet syndrome  Sacroiliac joint dysfunction    PLAN   Continue present medication hydrocodone acetaminophen and Neurontin.  F/U PCP Dr. Quillian QuinceBliss for evaliation of  BP and general medical  condition  F/U surgical evaluation. May consider pending follow-up evaluations  F/U neurological evaluation. May consider pending follow-up evaluations  May consider radiofrequency rhizolysis or intraspinal procedures pending response to present treatment and F/U evaluation   Patient to call [facility] Center should patient have concerns prior to scheduled return appointment.

## 2015-10-12 ENCOUNTER — Encounter: Payer: Self-pay | Admitting: Pain Medicine

## 2015-10-12 ENCOUNTER — Ambulatory Visit: Payer: Medicare Other | Attending: Pain Medicine | Admitting: Pain Medicine

## 2015-10-12 VITALS — BP 100/76 | HR 67 | Temp 98.1°F | Resp 18 | Ht 73.0 in | Wt 220.0 lb

## 2015-10-12 DIAGNOSIS — M47816 Spondylosis without myelopathy or radiculopathy, lumbar region: Secondary | ICD-10-CM

## 2015-10-12 DIAGNOSIS — M5481 Occipital neuralgia: Secondary | ICD-10-CM | POA: Diagnosis not present

## 2015-10-12 DIAGNOSIS — M5136 Other intervertebral disc degeneration, lumbar region: Secondary | ICD-10-CM | POA: Diagnosis not present

## 2015-10-12 DIAGNOSIS — M5030 Other cervical disc degeneration, unspecified cervical region: Secondary | ICD-10-CM | POA: Diagnosis present

## 2015-10-12 DIAGNOSIS — R51 Headache: Secondary | ICD-10-CM | POA: Diagnosis present

## 2015-10-12 DIAGNOSIS — M533 Sacrococcygeal disorders, not elsewhere classified: Secondary | ICD-10-CM

## 2015-10-12 DIAGNOSIS — Z9889 Other specified postprocedural states: Secondary | ICD-10-CM

## 2015-10-12 DIAGNOSIS — M47812 Spondylosis without myelopathy or radiculopathy, cervical region: Secondary | ICD-10-CM

## 2015-10-12 DIAGNOSIS — M51369 Other intervertebral disc degeneration, lumbar region without mention of lumbar back pain or lower extremity pain: Secondary | ICD-10-CM

## 2015-10-12 MED ORDER — GABAPENTIN 300 MG PO CAPS: ORAL_CAPSULE | ORAL | Status: DC

## 2015-10-12 MED ORDER — HYDROCODONE-ACETAMINOPHEN 5-325 MG PO TABS: ORAL_TABLET | ORAL | Status: DC

## 2015-10-12 NOTE — Patient Instructions (Addendum)
PLAN   Continue present medication hydrocodone acetaminophen and Neurontin.. May increase Neurontin by one more pill per day only if the Neurontin does not cause excessive drowsiness confusion respiratory depression or other side effects  F/U PCP Dr. Quillian QuinceBliss for evaliation of  BP and general medical  condition  F/U surgical evaluation. May consider pending follow-up evaluations  F/U neurological evaluation. May consider pending follow-up evaluations  May consider radiofrequency rhizolysis or intraspinal procedures pending response to present treatment and F/U evaluation   Patient to call [facility] should patient have concerns prior to scheduled return appointment.

## 2015-10-12 NOTE — Progress Notes (Signed)
Safety precautions to be maintained throughout the outpatient stay will include: orient to surroundings, keep bed in low position, maintain call bell within reach at all times, provide assistance with transfer out of bed and ambulation.  

## 2015-10-12 NOTE — Progress Notes (Signed)
   Subjective:    Patient ID: Troy Woodward, male    DOB: 11/02/1951, 64 y.o.   MRN: [id number]  HPI    Review of Systems     Objective:   Physical Exam        Assessment & Plan:

## 2015-10-12 NOTE — Progress Notes (Signed)
   Subjective:    Patient ID: Troy Woodward, male    DOB: 05/22/1951, 64 y.o.   MRN: [id number]  HPI  The patient is a 64 year old gentleman who returns to pain management for further evaluation and treatment of pain involving pain of the cervical region with pain radiating to the back of the hip precipitating headaches. The patient also has history of lumbar lower extremity pain. The patient states the pain is well-controlled at this time and patient is able to perform activities of daily living without significant pain interfering with activities of daily living. Patient is able to work and states that he is able to obtain restful sleep as well. We will continue Neurontin and hydrocodone acetaminophen as prescribed this time and remain available to consider additional interventional treatment pending response to treatment and follow-up evaluation. The patient agreed to suggested treatment plan.       Review of Systems     Objective:   Physical Exam  Since to palpation of the splenius capitis and occipitalis musculature regions palpation which reproduces pain of mild degree. There was mild tenderness to palpation over the region of the cervical facet cervical paraspinal musculature region as well as the thoracic facet thoracic paraspinal musculature region with no crepitus of the thoracic region noted. There was unremarkable Spurling's maneuver Palpation of the cervical region was evidence of muscle spasm of mild degree. Palpation over the lumbar paraspinal musculature region lumbar facet region was attends to palpation of mild to moderate degree with lateral bending rotation extension and palpation of the lumbar facets reproducing mild to moderate discomfort. Palpation over the PSIS and PII S region reproduced mild discomfort. There was mild tends to palpation of the greater trochanteric region and iliotibial band region. Straight leg raising was tolerates approximately 20 without increased  pain noted with dorsiflexion noted. It was negative clonus negative Homans. Abdomen was nontender with no costovertebral tenderness noted.     Assessment & Plan:  Degenerative disc disease lumbar spine Status post surgical intervention lumbar region  Lumbar facet syndrome  Sacroiliac joint dysfunction  Bilateral occipital neuralgia  Degenerative disc disease cervical spine  Cervical facet syndrome     PLAN  Continue present medication hydrocodone acetaminophen and Neurontin  F/U PCP Dr. Quillian QuinceBliss for evaliation of  BP and general medical  condition  F/U surgical evaluation. May consider pending follow-up evaluations  F/U neurological evaluation. May consider pending follow-up evaluations  May consider radiofrequency rhizolysis or intraspinal procedures pending response to present treatment and F/U evaluation   Patient to call [facility] Center should patient have concerns prior to scheduled return appointment.

## 2015-11-09 ENCOUNTER — Encounter: Payer: Self-pay | Admitting: Pain Medicine

## 2015-11-09 ENCOUNTER — Other Ambulatory Visit: Payer: Self-pay | Admitting: Pain Medicine

## 2015-11-09 ENCOUNTER — Ambulatory Visit: Payer: Medicare Other | Attending: Pain Medicine | Admitting: Pain Medicine

## 2015-11-09 VITALS — BP 97/60 | HR 71 | Temp 98.1°F | Resp 16 | Ht 72.0 in | Wt 220.0 lb

## 2015-11-09 DIAGNOSIS — Z9889 Other specified postprocedural states: Secondary | ICD-10-CM | POA: Insufficient documentation

## 2015-11-09 DIAGNOSIS — M47816 Spondylosis without myelopathy or radiculopathy, lumbar region: Secondary | ICD-10-CM

## 2015-11-09 DIAGNOSIS — M533 Sacrococcygeal disorders, not elsewhere classified: Secondary | ICD-10-CM | POA: Diagnosis not present

## 2015-11-09 DIAGNOSIS — M5136 Other intervertebral disc degeneration, lumbar region: Secondary | ICD-10-CM | POA: Diagnosis not present

## 2015-11-09 DIAGNOSIS — M5481 Occipital neuralgia: Secondary | ICD-10-CM

## 2015-11-09 DIAGNOSIS — R51 Headache: Secondary | ICD-10-CM | POA: Diagnosis present

## 2015-11-09 DIAGNOSIS — M5030 Other cervical disc degeneration, unspecified cervical region: Secondary | ICD-10-CM | POA: Insufficient documentation

## 2015-11-09 DIAGNOSIS — M47812 Spondylosis without myelopathy or radiculopathy, cervical region: Secondary | ICD-10-CM

## 2015-11-09 DIAGNOSIS — M542 Cervicalgia: Secondary | ICD-10-CM | POA: Diagnosis present

## 2015-11-09 DIAGNOSIS — M545 Low back pain: Secondary | ICD-10-CM | POA: Diagnosis present

## 2015-11-09 MED ORDER — GABAPENTIN 300 MG PO CAPS: ORAL_CAPSULE | ORAL | Status: DC

## 2015-11-09 MED ORDER — HYDROCODONE-ACETAMINOPHEN 5-325 MG PO TABS: ORAL_TABLET | ORAL | Status: DC

## 2015-11-09 NOTE — Patient Instructions (Signed)
PLAN   Continue present medication hydrocodone acetaminophen and Neurontin.  F/U PCP Dr. Bliss for evaliation of  BP and general medical  condition  F/U surgical evaluation. May consider pending follow-up evaluations  F/U neurological evaluation. May consider pending follow-up evaluations  May consider radiofrequency rhizolysis or intraspinal procedures pending response to present treatment and F/U evaluation   Patient to call [facility] should patient have concerns prior to scheduled return appointment. 

## 2015-11-09 NOTE — Progress Notes (Signed)
   Subjective:    Patient ID: Troy Woodward, male    DOB: 01/16/1951, 65 y.o.   MRN: [id number]  HPI  The patient is a 65 year old gentleman who returns to pain management Center for further evaluation and treatment of pain involving the neck headaches as well as pain of the lower back and lower extremity region. The patient states that he is tolerating hydrocodone acetaminophen and Neurontin fairly well. We discussed patient's medications and will continue hydrocodone acetaminophen and Neurontin as discussed. Patient is to avoid excessive sedation confusion and other undesirable side effects which can occur with these medications as we again readdressed to patient on today's visit. The patient denied any of these side effects. The patient denied significant pain interfering with activities of daily living and stated that he is able to perform activities of daily living as well as obtaining restful sleep without severe disabling pain interfering with any of his activities. We will remain available to consider patient for modifications of treatment regimen pending follow-up evaluation. The patient is to call pain management prior to scheduled return appointment should there be change in condition and we will consider modification of treatment regimen as discussed and as explained to patient on today's visit. The patient agreed to suggested treatment plan.             Review of Systems     Objective:   Physical Exam  There was tends to palpation of paraspinal must reason cervical region cervical facet region palpation which be produced pain of mild degree there was mild tenderness of the splenius capitis and occipitalis musculature region. Patient appeared to be with unremarkable Spurling's maneuver and there was minimal tenderness to palpation of the acromioclavicular and glenohumeral joint regions. Palpation of the thoracic facet thoracic paraspinal musculature region was attends to  palpation of mild degree with no crepitus of the thoracic region noted. Patient maneuver were without increased pain of significant degree and patient appeared to be with bilaterally equal grip strength. There was tends to palpation over the lumbar paraspinal muscular region lumbar facet region of mild to moderate degree with lateral bending rotation extension and palpation of the lumbar facets reproducing mild to moderate discomfort. There was mild tenderness to palpation over the PSIS and PII S region as well as the greater trochanteric region and iliotibial band region. Straight leg raise was tolerated to 30 without increased pain with dorsiflexion noted. EHL strength appeared to be decreased no definite sensory deficit or dermatomal distribution was detected. There was negative clonus negative Homans. Abdomen was nontender with no costovertebral tenderness noted.      Assessment & Plan:  Degenerative disc disease lumbar spine Status post surgical intervention lumbar region  Lumbar facet syndrome  Sacroiliac joint dysfunction  Bilateral occipital neuralgia  Degenerative disc disease cervical spine  Cervical facet syndrome    PLAN   Continue present medication hydrocodone acetaminophen and Neurontin..   F/U PCP Dr. Quillian QuinceBliss for evaliation of  BP and general medical  condition  F/U surgical evaluation. May consider pending follow-up evaluations patient prefers to avoid surgical intervention   F/U neurological evaluation. May consider pending follow-up evaluations  May consider radiofrequency rhizolysis or intraspinal procedures pending response to present treatment and F/U evaluation   Patient to call [facility] should patient have concerns prior to scheduled return appointment.

## 2015-11-09 NOTE — Progress Notes (Signed)
Safety precautions to be maintained throughout the outpatient stay will include: orient to surroundings, keep bed in low position, maintain call bell within reach at all times, provide assistance with transfer out of bed and ambulation.  

## 2015-11-14 LAB — TOXASSURE SELECT 13 (MW), URINE: PDF: 0

## 2015-12-08 ENCOUNTER — Encounter: Payer: Self-pay | Admitting: Pain Medicine

## 2015-12-08 ENCOUNTER — Ambulatory Visit: Payer: Medicare Other | Attending: Pain Medicine | Admitting: Pain Medicine

## 2015-12-08 VITALS — BP 124/63 | HR 75 | Temp 97.6°F | Resp 16 | Ht 72.0 in | Wt 220.0 lb

## 2015-12-08 DIAGNOSIS — M5136 Other intervertebral disc degeneration, lumbar region: Secondary | ICD-10-CM

## 2015-12-08 DIAGNOSIS — M545 Low back pain: Secondary | ICD-10-CM | POA: Diagnosis present

## 2015-12-08 DIAGNOSIS — M5481 Occipital neuralgia: Secondary | ICD-10-CM | POA: Diagnosis not present

## 2015-12-08 DIAGNOSIS — M5030 Other cervical disc degeneration, unspecified cervical region: Secondary | ICD-10-CM | POA: Diagnosis not present

## 2015-12-08 DIAGNOSIS — M533 Sacrococcygeal disorders, not elsewhere classified: Secondary | ICD-10-CM | POA: Diagnosis not present

## 2015-12-08 DIAGNOSIS — M47816 Spondylosis without myelopathy or radiculopathy, lumbar region: Secondary | ICD-10-CM

## 2015-12-08 DIAGNOSIS — Z9889 Other specified postprocedural states: Secondary | ICD-10-CM | POA: Diagnosis not present

## 2015-12-08 DIAGNOSIS — R51 Headache: Secondary | ICD-10-CM | POA: Diagnosis present

## 2015-12-08 DIAGNOSIS — M542 Cervicalgia: Secondary | ICD-10-CM | POA: Diagnosis present

## 2015-12-08 DIAGNOSIS — M47812 Spondylosis without myelopathy or radiculopathy, cervical region: Secondary | ICD-10-CM

## 2015-12-08 MED ORDER — GABAPENTIN 300 MG PO CAPS: ORAL_CAPSULE | ORAL | Status: DC

## 2015-12-08 MED ORDER — HYDROCODONE-ACETAMINOPHEN 5-325 MG PO TABS: ORAL_TABLET | ORAL | Status: DC

## 2015-12-08 NOTE — Patient Instructions (Addendum)
PLAN   Continue present medication hydrocodone acetaminophen and Neurontin..   F/U PCP Dr. Quillian Quince for evaluation of  BP and general medical  condition  F/U surgical evaluation. May consider pending follow-up evaluations  F/U neurological evaluation. May consider pending follow-up evaluations  May consider radiofrequency rhizolysis or intraspinal procedures pending response to present treatment and F/U evaluation   Patient to call [facility] should patient have concerns prior to scheduled return appointment.

## 2015-12-08 NOTE — Progress Notes (Signed)
   Subjective:    Patient ID: Troy Woodward, male    DOB: [birth year], 65 y.o.   MRN: [id number]  HPI  The patient is a 65 year old gentleman who returns to pain management for further evaluation and treatment of pain involving the neck associated with headaches as well as pain involving the lower back and lower extremity region. The patient is tolerating Neurontin and hydrocodone acetaminophen very well without undesirable side effects. The patient is able to perform activities of daily living without experiencing severe disabling pain. We discussed patient's condition and will continue present medications. We will avoid interventional treatment at this time. The patient is with understanding and agreed to suggested treatment plan.      Review of Systems     Objective:   Physical Exam  There was tenderness of the splenius capitis and occipitalis musculature region a mild degree with mild tenderness over the cervical facet cervical paraspinal musculature region. There was mild tenderness of the trapezius levator scapula rhomboid musculature regions as well. There was mild tenderness of the acromioclavicular and glenohumeral joint regions. The patient appeared to be with bilaterally equal grip strength. Tinel and Phalen's maneuver were without increase of pain of significant degree. There appeared to be unremarkable Spurling's maneuver. Palpation over the thoracic facet thoracic paraspinal must reason was evidence of muscle spasms of moderate degree with no crepitus of the thoracic region noted. Palpation over the lumbar paraspinal musculature region lumbar facet region was attends to palpation of moderate degree with lateral bending rotation extension and palpation of the lumbar facets reproducing moderate discomfort. Straight leg raising was tolerated to 30 without increased pain with dorsiflexion noted. There was negative clonus negative Homans knees with crepitus with no definite increased pain  with range of motion maneuvers of the knees. There was negative anterior and posterior drawer signs. No sensory deficit or dermatomal distribution of the lower extremity is noted. There was mild tenderness of the PSIS and PII S region as well as the greater trochanteric region and iliotibial band region. Abdomen nontender with no costovertebral tenderness noted.      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   F/U PCP Dr. Quillian Quince for evaluation of  BP and general medical  condition  F/U surgical evaluation. May consider pending follow-up evaluations  F/U neurological evaluation. May consider pending follow-up evaluations  May consider radiofrequency rhizolysis or intraspinal procedures pending response to present treatment and F/U evaluation   Patient to call Pain Management Center should patient have concerns prior to scheduled return appointment.

## 2015-12-08 NOTE — Progress Notes (Signed)
Safety precautions to be maintained throughout the outpatient stay will include: orient to surroundings, keep bed in low position, maintain call bell within reach at all times, provide assistance with transfer out of bed and ambulation.  

## 2016-01-05 ENCOUNTER — Encounter: Payer: Self-pay | Admitting: Pain Medicine

## 2016-01-05 ENCOUNTER — Ambulatory Visit: Payer: Medicare Other | Attending: Pain Medicine | Admitting: Pain Medicine

## 2016-01-05 VITALS — BP 117/66 | HR 65 | Temp 97.8°F | Resp 16 | Ht 72.0 in | Wt 220.0 lb

## 2016-01-05 DIAGNOSIS — M533 Sacrococcygeal disorders, not elsewhere classified: Secondary | ICD-10-CM | POA: Diagnosis not present

## 2016-01-05 DIAGNOSIS — M5136 Other intervertebral disc degeneration, lumbar region: Secondary | ICD-10-CM | POA: Insufficient documentation

## 2016-01-05 DIAGNOSIS — M542 Cervicalgia: Secondary | ICD-10-CM | POA: Diagnosis present

## 2016-01-05 DIAGNOSIS — M5030 Other cervical disc degeneration, unspecified cervical region: Secondary | ICD-10-CM

## 2016-01-05 DIAGNOSIS — M47812 Spondylosis without myelopathy or radiculopathy, cervical region: Secondary | ICD-10-CM

## 2016-01-05 DIAGNOSIS — M51369 Other intervertebral disc degeneration, lumbar region without mention of lumbar back pain or lower extremity pain: Secondary | ICD-10-CM

## 2016-01-05 DIAGNOSIS — M5481 Occipital neuralgia: Secondary | ICD-10-CM

## 2016-01-05 DIAGNOSIS — M545 Low back pain: Secondary | ICD-10-CM | POA: Diagnosis present

## 2016-01-05 DIAGNOSIS — M47816 Spondylosis without myelopathy or radiculopathy, lumbar region: Secondary | ICD-10-CM

## 2016-01-05 DIAGNOSIS — Z9889 Other specified postprocedural states: Secondary | ICD-10-CM

## 2016-01-05 MED ORDER — HYDROCODONE-ACETAMINOPHEN 5-325 MG PO TABS: ORAL_TABLET | ORAL | Status: DC

## 2016-01-05 MED ORDER — GABAPENTIN 300 MG PO CAPS: ORAL_CAPSULE | ORAL | Status: DC

## 2016-01-05 NOTE — Patient Instructions (Signed)
PLAN   Continue present medication hydrocodone acetaminophen and Neurontin..   F/U PCP Dr. Quillian Quince for evaluation of  BP and general medical  condition  F/U surgical evaluation. May consider pending follow-up evaluations  F/U neurological evaluation. May consider pending follow-up evaluations  May consider radiofrequency rhizolysis or intraspinal procedures pending response to present treatment and F/U evaluation   Patient to call Pain Management Center should patient have concerns prior to scheduled return appointment.

## 2016-01-05 NOTE — Progress Notes (Signed)
Safety precautions to be maintained throughout the outpatient stay will include: orient to surroundings, keep bed in low position, maintain call bell within reach at all times, provide assistance with transfer out of bed and ambulation.  

## 2016-01-05 NOTE — Progress Notes (Signed)
   Subjective:    Patient ID: Troy Woodward, male    DOB: 23-Feb-1951, 65 y.o.   MRN: [id number]  HPI  The patient is a 65 year old gentleman who returns to pain management for further evaluation and treatment of pain involving the cervical upper extremity region and lumbar lower extremity region. The patient denies any significant headache of pain involving the neck upper extremity region of the lower back and lower extremity region. The patient states that he is quite pleased present medication regimen consisting of hydrocodone acetaminophen and Neurontin. The patient denies any trauma change in events of daily living the call significant change in symptomatology. We've discussed patient undergoing further evaluation including surgical as well as ophthalmological  evaluation. Patient prefers to avoid surgical intervention and prefers to avoid surgical evaluation at this time. The patient stated that he was able to perform most activities of daily living with facet really 7 severely disabling pain interfering with activities. The patient also states that he is able to sleep without a definite pain interfering with ability to obtain restful sleep. Decision has been made continue present medications and we will consider interventional treatment pending response to treatment and follow-up evaluation. All agreed with suggested treatment plan                    Review of Systems     Objective:   Physical Exam  There was tenderness to palpation of the splenius capitis and occipitalis musculature region a mild degree. There were no bounding pulsations of the temporal region noted. Palpation of the cervical facet cervical paraspinal musculature region was attends to palpation of moderate degree. No crepitus of the thoracic region was noted. Palpation of the acromioclavicular and glenohumeral joint regions were with minimal increase of pain. Patient appeared to be with bilaterally equal grip  strength. Tinel and Phalen's maneuver were without increased pain of significant degree. There was no crepitus of the thoracic region noted. Palpation of the lower thoracic paraspinal musculature region was attends to palpation of moderate degree. Palpation over the lumbar paraspinal musculature region lumbar facet region was moderately severe discomfort. Lateral bending and rotation reduce minimal discomfort. The patient was able to stand on tiptoes and heels with mild difficulty there was tenderness to palpation over the PSIS and PII S region a mild degree there was mild tenderness to palpation of the greater trochanteric region and iliotibial band region there was minimal tenderness tenderness to palpation of the knees with negative anterior and posterior drawer signs without ballottement of the patella crepitus of the knees was noted Straight leg raising was limited 30 without increased pain with dorsiflexion noted. There was negative clonus negative Homans. Abdomen was nontender with no costovertebral tenderness noted      Assessment & Plan:     Degenerative disc disease lumbar spine Status post surgical intervention lumbar region  Lumbar facet syndrome  Sacroiliac joint dysfunction  Bilateral occipital neuralgia  Degenerative disc disease cervical spine  Cervical facet syndrome     PLAN   Continue present medication hydrocodone acetaminophen and Neurontin..   F/U PCP Dr. Quillian Quince for evaluation of  BP and general medical  condition  F/U surgical evaluation. May consider pending follow-up evaluations  F/U neurological evaluation. May consider pending follow-up evaluations  May consider radiofrequency rhizolysis or intraspinal procedures pending response to present treatment and F/U evaluation   Patient to call Pain Management Center should patient have concerns prior to scheduled return appointment.

## 2016-02-07 ENCOUNTER — Encounter: Payer: Self-pay | Admitting: Pain Medicine

## 2016-02-07 ENCOUNTER — Ambulatory Visit: Payer: Medicare Other | Attending: Pain Medicine | Admitting: Pain Medicine

## 2016-02-07 VITALS — BP 112/76 | HR 61 | Temp 97.8°F | Resp 16 | Ht 72.0 in | Wt 220.0 lb

## 2016-02-07 DIAGNOSIS — M546 Pain in thoracic spine: Secondary | ICD-10-CM | POA: Diagnosis present

## 2016-02-07 DIAGNOSIS — Z9889 Other specified postprocedural states: Secondary | ICD-10-CM

## 2016-02-07 DIAGNOSIS — M5030 Other cervical disc degeneration, unspecified cervical region: Secondary | ICD-10-CM

## 2016-02-07 DIAGNOSIS — M47816 Spondylosis without myelopathy or radiculopathy, lumbar region: Secondary | ICD-10-CM

## 2016-02-07 DIAGNOSIS — M5136 Other intervertebral disc degeneration, lumbar region: Secondary | ICD-10-CM | POA: Insufficient documentation

## 2016-02-07 DIAGNOSIS — M533 Sacrococcygeal disorders, not elsewhere classified: Secondary | ICD-10-CM | POA: Diagnosis not present

## 2016-02-07 DIAGNOSIS — M5481 Occipital neuralgia: Secondary | ICD-10-CM | POA: Diagnosis not present

## 2016-02-07 DIAGNOSIS — Z7901 Long term (current) use of anticoagulants: Secondary | ICD-10-CM | POA: Diagnosis not present

## 2016-02-07 DIAGNOSIS — M79606 Pain in leg, unspecified: Secondary | ICD-10-CM | POA: Diagnosis present

## 2016-02-07 DIAGNOSIS — M542 Cervicalgia: Secondary | ICD-10-CM | POA: Diagnosis present

## 2016-02-07 DIAGNOSIS — M47812 Spondylosis without myelopathy or radiculopathy, cervical region: Secondary | ICD-10-CM

## 2016-02-07 DIAGNOSIS — M51369 Other intervertebral disc degeneration, lumbar region without mention of lumbar back pain or lower extremity pain: Secondary | ICD-10-CM

## 2016-02-07 MED ORDER — GABAPENTIN 300 MG PO CAPS: ORAL_CAPSULE | ORAL | Status: DC

## 2016-02-07 MED ORDER — HYDROCODONE-ACETAMINOPHEN 5-325 MG PO TABS: ORAL_TABLET | ORAL | Status: DC

## 2016-02-07 NOTE — Patient Instructions (Addendum)
PLAN   Continue present medication hydrocodone acetaminophen and Neurontin..   F/U PCP Dr. Quillian QuinceBliss for evaluation of  BP and general medical  condition  F/U surgical evaluation. May consider pending follow-up evaluations  F/U neurological evaluation. May consider PNCV/EMG studies and other studies pending follow-up evaluations  May consider radiofrequency rhizolysis or intraspinal procedures pending response to present treatment and F/U evaluation   Patient to call Pain Management Center should patient have concerns prior to scheduled return appointment.Pain Management Discharge Instructions  General Discharge Instructions :  If you need to reach your doctor call: Monday-Friday 8:00 am - 4:00 pm at [phone] or toll free [phone].  After clinic hours [phone] to have operator reach doctor.  Bring all of your medication bottles to all your appointments in the pain clinic.  To cancel or reschedule your appointment with Pain Management please remember to call 24 hours in advance to avoid a fee.  Refer to the educational materials which you have been given on: General Risks, I had my Procedure. Discharge Instructions, Post Sedation.  Post Procedure Instructions:  The drugs you were given will stay in your system until tomorrow, so for the next 24 hours you should not drive, make any legal decisions or drink any alcoholic beverages.  You may eat anything you prefer, but it is better to start with liquids then soups and crackers, and gradually work up to solid foods.  Please notify your doctor immediately if you have any unusual bleeding, trouble breathing or pain that is not related to your normal pain.  Depending on the type of procedure that was done, some parts of your body may feel week and/or numb.  This usually clears up by tonight or the next day.  Walk with the use of an assistive device or accompanied by an adult for the 24 hours.  You may use ice on the affected  area for the first 24 hours.  Put ice in a Ziploc bag and cover with a towel and place against area 15 minutes on 15 minutes off.  You may switch to heat after 24 hours.

## 2016-02-07 NOTE — Progress Notes (Signed)
   Subjective:    Patient ID: Troy Woodward, male    DOB: 03/25/1951, 65 y.o.   MRN: [id number]  HPI  The patient is a 65 year old gentleman who returns to pain management for further evaluation and treatment of pain involving neck entire back upper and lower extremity region. The patient states the pain is fairly well-controlled at this time the patient [place] with cleaning residence and without exacerbation of severe pain. The patient denies any trauma change in events of daily living the call significant change in symptoms pathology. We discussed patient's condition and patient continues to tolerate Neurontin and hydrocodone acetaminophen well without undesirable side effects. The patient continues anticoagulant therapy and we will avoid interventional treatment in the intraspinal region at this time. We discussed surgical and neurological evaluations and will avoid both at this time. The patient states the pain is fairly well-controlled present treatment regimen and wished to continue the present treatment regimen consisting of Neurontin and hydrocodone acetaminophen. All agreed to suggested treatment plan.  Review of Systems     Objective:   Physical Exam  There was tenderness to palpation of the splenius capitis and occipitalis musculature regions of mild degree with moderate mild tenderness over the cervical facet cervical paraspinal musculature region. There were no masses of the head and neck noted and no bounding pulsations of the temporal region noted. Palpation of the acromioclavicular and glenohumeral joint regions were with mild discomfort. Patient appeared to be with unremarkable Spurling's maneuver and Tinel and Phalen's maneuver were without increase of pain of significant degree and patient appeared to be with bilaterally equal grip strength. Palpation over the thoracic region thoracic facet region was with no crepitus of the thoracic region  and was with evidence of moderate muscle spasm of the lower thoracic paraspinal musculature region. Palpation over the lumbar paraspinal musculature region lumbar facet region was attends to palpation of moderate degree with lateral bending rotation extension and palpation of the lumbar facets reproducing moderate discomfort. Straight leg raising was tolerates approximately 30 without increase of pain with dorsiflexion noted. The knees were with crepitus of the knees with negative anterior and posterior drawer signs.There was negative clonus negative Homans no sensory deficit or dermatomal distribution detected. There was mild to moderate tenderness over the PSIS and PII S region with minimal tenderness of the greater trochanteric region iliotibial band region.      Assessment & Plan:       Degenerative disc disease lumbar spine Status post surgical intervention lumbar region  Lumbar facet syndrome  Sacroiliac joint dysfunction  Bilateral occipital neuralgia  Degenerative disc disease cervical spine  Cervical facet syndrome     PLAN   Continue present medication hydrocodone acetaminophen and Neurontin..   F/U PCP Dr. Quillian QuinceBliss for evaluation of  BP and general medical  condition  F/U surgical evaluation. May consider pending follow-up evaluations  F/U neurological evaluation. May consider PNCV/EMG studies and other studies pending follow-up evaluations  May consider radiofrequency rhizolysis or intraspinal procedures pending response to present treatment and F/U evaluation   Patient to call Pain Management Center should patient have concerns prior to scheduled return appointment

## 2016-02-07 NOTE — Progress Notes (Signed)
Safety precautions to be maintained throughout the outpatient stay will include: orient to surroundings, keep bed in low position, maintain call bell within reach at all times, provide assistance with transfer out of bed and ambulation.  

## 2016-03-06 ENCOUNTER — Encounter: Payer: Self-pay | Admitting: Pain Medicine

## 2016-03-06 ENCOUNTER — Ambulatory Visit: Payer: Medicare Other | Attending: Pain Medicine | Admitting: Pain Medicine

## 2016-03-06 VITALS — BP 130/70 | HR 62 | Temp 97.7°F | Resp 16 | Ht 72.0 in | Wt 217.0 lb

## 2016-03-06 DIAGNOSIS — M5481 Occipital neuralgia: Secondary | ICD-10-CM | POA: Insufficient documentation

## 2016-03-06 DIAGNOSIS — M542 Cervicalgia: Secondary | ICD-10-CM | POA: Diagnosis present

## 2016-03-06 DIAGNOSIS — Z9889 Other specified postprocedural states: Secondary | ICD-10-CM | POA: Diagnosis not present

## 2016-03-06 DIAGNOSIS — M5030 Other cervical disc degeneration, unspecified cervical region: Secondary | ICD-10-CM | POA: Insufficient documentation

## 2016-03-06 DIAGNOSIS — M47816 Spondylosis without myelopathy or radiculopathy, lumbar region: Secondary | ICD-10-CM

## 2016-03-06 DIAGNOSIS — M5136 Other intervertebral disc degeneration, lumbar region: Secondary | ICD-10-CM | POA: Diagnosis not present

## 2016-03-06 DIAGNOSIS — M47812 Spondylosis without myelopathy or radiculopathy, cervical region: Secondary | ICD-10-CM

## 2016-03-06 DIAGNOSIS — M79606 Pain in leg, unspecified: Secondary | ICD-10-CM | POA: Diagnosis present

## 2016-03-06 DIAGNOSIS — M533 Sacrococcygeal disorders, not elsewhere classified: Secondary | ICD-10-CM | POA: Diagnosis not present

## 2016-03-06 DIAGNOSIS — M545 Low back pain: Secondary | ICD-10-CM | POA: Diagnosis present

## 2016-03-06 MED ORDER — HYDROCODONE-ACETAMINOPHEN 5-325 MG PO TABS: ORAL_TABLET | ORAL | Status: DC

## 2016-03-06 MED ORDER — GABAPENTIN 300 MG PO CAPS: ORAL_CAPSULE | ORAL | Status: DC

## 2016-03-06 NOTE — Progress Notes (Signed)
Safety precautions to be maintained throughout the outpatient stay will include: orient to surroundings, keep bed in low position, maintain call bell within reach at all times, provide assistance with transfer out of bed and ambulation.  

## 2016-03-06 NOTE — Patient Instructions (Signed)
PLAN   Continue present medication hydrocodone acetaminophen and Neurontin..   F/U PCP Dr. Quillian QuinceBliss for evaluation of  BP and general medical  condition  F/U surgical evaluation. May consider pending follow-up evaluations  F/U neurological evaluation. May consider PNCV/EMG studies and other studies pending follow-up evaluations  May consider radiofrequency rhizolysis or intraspinal procedures pending response to present treatment and F/U evaluation   Patient to call Pain Management Center should patient have concerns prior to scheduled return appointment.

## 2016-03-06 NOTE — Progress Notes (Signed)
   Subjective:    Patient ID: Derrek GuMinford L Barbour, male    DOB: 06/07/1951, 65 y.o.   MRN: [id number]  HPI  The patient is a 65 year old gentleman who returns to pain management for further evaluation and treatment of pain involving the neck upper extremity region as well as the lower back and lower extremity region. The patient states the pain is fairly well-controlled at this time. The patient denies any trauma change in events of daily living the call significant change in symptomatology. Patient continues Neurontin and hydrocodone acetaminophen and denies any undesirable side effects. The patient also stated that he had apply ice to his back following activities such as cutting the grass and that the icing to minimize the pain after performing the actual of cutting the grass. The patient denied any trauma change in events of daily living the call significant change in symptoms pathology. The patient was with complaint without complaint of headache and stated that he was with pain fairly well-controlled at this time. We will continue present medications consisting of Neurontin and hydrocodone acetaminophen and will consider modification of treatment regimen pending response to treatment and follow-up evaluation. All agreed to suggested treatment plan.  Review of Systems     Objective:   Physical Exam  There was tenderness of the splenius capitis and occipitalis musculature regions of mild to moderate degree with mild to moderate tenderness over the cervical facet cervical paraspinal musculature regions. Palpation of the acromioclavicular and glenohumeral joint regions were with mild tinnitus to palpation with unremarkable Spurling's maneuver. Tinel and Phalen's maneuver were without increase of pain of significant degree. There was tenderness to palpation over the region of the thoracic facet thoracic paraspinal musculature region palpation which be produced pain of moderate degree. There was tends to  palpation over the lumbar paraspinal must reason lumbar facet region palpation which reproduces moderate discomfort as well. Palpation over the PSIS and PII S region reproduced moderate discomfort. Lateral bending rotation extension and palpation of the lumbar facets was with moderate to moderately severe increase of pain with tenderness over the greater trochanteric region iliotibial band region a mild degree. Straight leg raising was tolerates approximately 30 without increased pain with dorsiflexion noted. No definite sensory deficit or dermatomal dystrophy detected. The knees were with tenderness to palpation in crepitus of the knees with negative anterior and posterior drawer signs without ballottement of the patella. There was no definite sensory deficit of dermatomal distribution of the lower extremities noted. There was negative clonus negative Homans. Abdomen nontender and no costovertebral tenderness noted      Assessment & Plan:    Degenerative disc disease lumbar spine Status post surgical intervention lumbar region  Lumbar facet syndrome  Sacroiliac joint dysfunction  Bilateral occipital neuralgia  Degenerative disc disease cervical spine  Cervical facet syndrome     PLAN   Continue present medication hydrocodone acetaminophen and Neurontin..   F/U PCP Dr. Quillian QuinceBliss for evaluation of  BP and general medical  condition  F/U surgical evaluation. May consider pending follow-up evaluations  Massage the area of the lumbar region and may apply ice to the lumbar region if tolerated without undesirable side effects as discussed  F/U neurological evaluation. May consider PNCV/EMG studies and other studies pending follow-up evaluations  May consider radiofrequency rhizolysis or intraspinal procedures pending response to present treatment and F/U evaluation   Patient to call Pain Management Center should patient have concerns prior to scheduled return appointment.

## 2016-03-11 LAB — TOXASSURE SELECT 13 (MW), URINE

## 2016-03-12 NOTE — Progress Notes (Signed)
Quick Note:  Reviewed. ______ 

## 2016-04-05 ENCOUNTER — Encounter: Payer: Self-pay | Admitting: Pain Medicine

## 2016-04-05 ENCOUNTER — Ambulatory Visit: Payer: Medicare Other | Attending: Pain Medicine | Admitting: Pain Medicine

## 2016-04-05 VITALS — BP 123/59 | HR 51 | Temp 97.7°F | Resp 15 | Ht 72.0 in | Wt 220.0 lb

## 2016-04-05 DIAGNOSIS — M533 Sacrococcygeal disorders, not elsewhere classified: Secondary | ICD-10-CM | POA: Diagnosis not present

## 2016-04-05 DIAGNOSIS — M5481 Occipital neuralgia: Secondary | ICD-10-CM

## 2016-04-05 DIAGNOSIS — M5030 Other cervical disc degeneration, unspecified cervical region: Secondary | ICD-10-CM | POA: Diagnosis not present

## 2016-04-05 DIAGNOSIS — Z7901 Long term (current) use of anticoagulants: Secondary | ICD-10-CM | POA: Diagnosis not present

## 2016-04-05 DIAGNOSIS — Z9889 Other specified postprocedural states: Secondary | ICD-10-CM

## 2016-04-05 DIAGNOSIS — M542 Cervicalgia: Secondary | ICD-10-CM | POA: Diagnosis present

## 2016-04-05 DIAGNOSIS — M51369 Other intervertebral disc degeneration, lumbar region without mention of lumbar back pain or lower extremity pain: Secondary | ICD-10-CM

## 2016-04-05 DIAGNOSIS — M5136 Other intervertebral disc degeneration, lumbar region: Secondary | ICD-10-CM | POA: Insufficient documentation

## 2016-04-05 DIAGNOSIS — M47816 Spondylosis without myelopathy or radiculopathy, lumbar region: Secondary | ICD-10-CM

## 2016-04-05 DIAGNOSIS — R51 Headache: Secondary | ICD-10-CM | POA: Diagnosis present

## 2016-04-05 DIAGNOSIS — M47812 Spondylosis without myelopathy or radiculopathy, cervical region: Secondary | ICD-10-CM

## 2016-04-05 MED ORDER — GABAPENTIN 300 MG PO CAPS: ORAL_CAPSULE | ORAL | Status: DC

## 2016-04-05 MED ORDER — HYDROCODONE-ACETAMINOPHEN 5-325 MG PO TABS: ORAL_TABLET | ORAL | Status: DC

## 2016-04-05 NOTE — Patient Instructions (Signed)
PLAN   Continue present medication hydrocodone acetaminophen and Neurontin..   F/U PCP Dr. Quillian QuinceBliss for evaluation of  BP and general medical  Condition as discussed  F/U surgical evaluation. May consider pending follow-up evaluations  F/U neurological evaluation. May consider PNCV/EMG studies and other studies pending follow-up evaluations  May consider radiofrequency rhizolysis or intraspinal procedures pending response to present treatment and F/U evaluation   Patient to call Pain Management Center should patient have concerns prior to scheduled return appointment.

## 2016-04-05 NOTE — Progress Notes (Signed)
   Subjective:    Patient ID: Troy Woodward, male    DOB: 05/14/1951, 65 y.o.   MRN: [id number]  HPI  The patient is a 65 year old gentleman who returns to pain management for further evaluation and treatment of pain involving the neck associated with headaches as well as pain involving the upper mid lower back and lower extremity regions. The patient is able to perform most activities of daily living without experiencing severe disabling pain. The patient denies any recent trauma change in events of daily living to cause significant change in symptomatology. We discussed patient's condition on today's visit and patient continues anticoagulant therapy. We preferred to avoid interruption of patient's anticoagulant therapy and will continue presently prescribed medications consisting of Neurontin and hydrocodone acetaminophen. The patient agreed to suggested treatment plan. The patient is to call pain management should there be significant change in condition prior to scheduled return appointment. All agreed to suggested treatment plan     Review of Systems     Objective:   Physical Exam  There was tends to palpation of paraspinal musculatures and the cervical region cervical facet region palpation which be produced pain of moderate degree. There was moderate tenderness of the splenius capitis and occipitalis musculature region as well as tends to palpation over the trapezius levator scapula and rhomboid musculature regions. Palpation of the acromioclavicular and glenohumeral joint regions were tenderness to palpation and patient appeared to be with unremarkable Spurling's maneuver. The patient was able to perform drop test with mild difficulty to moderate difficulty. Palpation over the region of the thoracic region was with no crepitus of the thoracic region noted. Palpation over the region of the lumbar paraspinal must reason lumbar facet region was with increased pain with lateral bending rotation  extension and palpation of the lumbar facets reproducing mild to moderate discomfort. There was mild to moderate tenderness of the PSIS and PII S region as well as the gluteal and piriformis musculature regions. There was mild tenderness along the greater trochanteric region iliotibial band region. Elicit appeared to be trace at the knees. EHL strength appeared to be slightly decreased. No definite sensory deficit or dermatomal distribution detected. There was negative clonus negative Homans. Abdomen nontender with no costovertebral tenderness noted      Assessment & Plan:     Degenerative disc disease lumbar spine Status post surgical intervention lumbar region  Lumbar facet syndrome  Sacroiliac joint dysfunction  Bilateral occipital neuralgia  Degenerative disc disease cervical spine  Cervical facet syndrome      PLAN   Continue present medication hydrocodone acetaminophen and Neurontin..   F/U PCP Dr. Quillian QuinceBliss for evaluation of  BP and general medical  Condition as discussed  F/U surgical evaluation. May consider pending follow-up evaluations  F/U neurological evaluation. May consider PNCV/EMG studies and other studies pending follow-up evaluations  May consider radiofrequency rhizolysis or intraspinal procedures pending response to present treatment and F/U evaluation   Patient to call Pain Management Center should patient have concerns prior to scheduled return appointment.

## 2016-04-05 NOTE — Progress Notes (Signed)
Safety precautions to be maintained throughout the outpatient stay will include: orient to surroundings, keep bed in low position, maintain call bell within reach at all times, provide assistance with transfer out of bed and ambulation.  

## 2016-05-03 ENCOUNTER — Ambulatory Visit: Payer: Medicare Other | Attending: Pain Medicine | Admitting: Pain Medicine

## 2016-05-03 ENCOUNTER — Encounter: Payer: Self-pay | Admitting: Pain Medicine

## 2016-05-03 VITALS — BP 144/60 | HR 115 | Temp 97.5°F | Resp 16 | Ht 72.0 in | Wt 220.0 lb

## 2016-05-03 DIAGNOSIS — Z9889 Other specified postprocedural states: Secondary | ICD-10-CM

## 2016-05-03 DIAGNOSIS — M51369 Other intervertebral disc degeneration, lumbar region without mention of lumbar back pain or lower extremity pain: Secondary | ICD-10-CM

## 2016-05-03 DIAGNOSIS — M5481 Occipital neuralgia: Secondary | ICD-10-CM

## 2016-05-03 DIAGNOSIS — M533 Sacrococcygeal disorders, not elsewhere classified: Secondary | ICD-10-CM | POA: Diagnosis not present

## 2016-05-03 DIAGNOSIS — M5136 Other intervertebral disc degeneration, lumbar region: Secondary | ICD-10-CM | POA: Insufficient documentation

## 2016-05-03 DIAGNOSIS — M5030 Other cervical disc degeneration, unspecified cervical region: Secondary | ICD-10-CM | POA: Diagnosis not present

## 2016-05-03 DIAGNOSIS — M62830 Muscle spasm of back: Secondary | ICD-10-CM | POA: Insufficient documentation

## 2016-05-03 DIAGNOSIS — M47816 Spondylosis without myelopathy or radiculopathy, lumbar region: Secondary | ICD-10-CM

## 2016-05-03 DIAGNOSIS — M47812 Spondylosis without myelopathy or radiculopathy, cervical region: Secondary | ICD-10-CM

## 2016-05-03 DIAGNOSIS — R51 Headache: Secondary | ICD-10-CM | POA: Diagnosis present

## 2016-05-03 DIAGNOSIS — M542 Cervicalgia: Secondary | ICD-10-CM | POA: Diagnosis present

## 2016-05-03 MED ORDER — HYDROCODONE-ACETAMINOPHEN 5-325 MG PO TABS: ORAL_TABLET | ORAL | Status: DC

## 2016-05-03 MED ORDER — GABAPENTIN 300 MG PO CAPS: ORAL_CAPSULE | ORAL | Status: DC

## 2016-05-03 NOTE — Patient Instructions (Addendum)
PLAN   Continue present medication hydrocodone acetaminophen and Neurontin..   F/U PCP Dr. Quillian QuinceBliss for evaluation of  BP and general medical  Condition as discussed  F/U surgical evaluation. May consider pending follow-up evaluations. Patient prefers to avoid surgical evaluation at this time  F/U neurological evaluation. May consider PNCV/EMG studies and other studies pending follow-up evaluations. We will avoid additional studies at this time  May consider radiofrequency rhizolysis or intraspinal procedures pending response to present treatment and F/U evaluation . We will avoid such procedures at this time  Patient to call Pain Management Center should patient have concerns prior to scheduled return appointment.Pain Management Discharge Instructions  General Discharge Instructions :  If you need to reach your doctor call: Monday-Friday 8:00 am - 4:00 pm at [phone] or toll free [phone].  After clinic hours [phone] to have operator reach doctor.  Bring all of your medication bottles to all your appointments in the pain clinic.  To cancel or reschedule your appointment with Pain Management please remember to call 24 hours in advance to avoid a fee.  Refer to the educational materials which you have been given on: General Risks, I had my Procedure. Discharge Instructions, Post Sedation.  Post Procedure Instructions:  The drugs you were given will stay in your system until tomorrow, so for the next 24 hours you should not drive, make any legal decisions or drink any alcoholic beverages.  You may eat anything you prefer, but it is better to start with liquids then soups and crackers, and gradually work up to solid foods.  Please notify your doctor immediately if you have any unusual bleeding, trouble breathing or pain that is not related to your normal pain.  Depending on the type of procedure that was done, some parts of your body may feel week and/or numb.  This usually  clears up by tonight or the next day.  Walk with the use of an assistive device or accompanied by an adult for the 24 hours.  You may use ice on the affected area for the first 24 hours.  Put ice in a Ziploc bag and cover with a towel and place against area 15 minutes on 15 minutes off.  You may switch to heat after 24 hours.

## 2016-05-03 NOTE — Progress Notes (Signed)
   Subjective:    Patient ID: Troy Woodward, male    DOB: 02/14/1951, 65 y.o.   MRN: [id number]  HPI  Patient is a 65 year old gentleman who returns to pain management for further evaluation and treatment of pain involving the neck associated with headaches as well as pain of the lower back and lower extremity region. The patient states that he continues to do well at this time and is without significant pain interfering with activities of daily living or ability to obtain restful sleep. The patient continues Neurontin and hydrocodone acetaminophen. We informed patient that we will consider interventional treatment should they be return of any significant symptoms. We will continue medications as prescribed and we will remain available to consider modification of treatment regimen pending follow-up evaluation.  Review of Systems     Objective:   Physical Exam  Palpation of the cervical region cervical facet region was attends to palpation of mild degree. There was tenderness of the splenius capitis and occipitalis region palpation which be produced mild to moderate discomfort. No masses of the head and neck were noted. There were no bounding pulsations of the temporal region noted. Palpation over the region of the thoracic region thoracic facet region was attends to palpation of mild to moderate degree. The patient was able to perform drop test without significant difficulty. The patient appeared to be with bilaterally equal grip strength with Tinel and Phalen's maneuver reproducing minimal discomfort. Palpation of the thoracic region was with no crepitus of the thoracic region with evidence of mild to moderate muscle spasm of the mid and lower thoracic. Palpation over the lumbar region lumbar paraspinal musculature region was attends to palpation of mild to moderate degree with lateral bending rotation extension and palpation of the lumbar facets reproducing mild to moderate discomfort. Straight leg  raising was tolerates' 30 without increased pain with dorsiflexion noted. DTRs were difficult to elicit at the knees. There was crepitus of the knees with negative anterior posterior drawer signs without ballottement of the patella. Palpation of the PSIS and PII S region reproduced mild to moderate discomfort. There was noted sensory deficit or dermatomal distribution detected. There was negative clonus negative Homans. Abdomen was nontender with no costovertebral maintenance noted.      Assessment & Plan:     Degenerative disc disease lumbar spine Status post surgical intervention lumbar region  Lumbar facet syndrome  Sacroiliac joint dysfunction  Bilateral occipital neuralgia  Degenerative disc disease cervical spine  Cervical facet syndrome       PLAN  Continue present medication hydrocodone acetaminophen and Neurontin..   F/U PCP Dr. Quillian QuinceBliss for evaluation of BP and general medica lcondition as discussed   F/U surgical evaluation. May consider pending follow-up evaluations. Patient prefers to avoid surgical evaluation at this time   F/U neurological evaluation. May consider PNCV/EMG studies and other studies pending follow-up evaluations. We will avoid additional studies at this time  May consider radiofrequency rhizolysis or intraspinal procedures pending response to present treatment and F/U evaluation . We will avoid such procedures at this time   Patient to call Pain Management Center should patient have concerns prior to scheduled return appointment

## 2016-05-03 NOTE — Progress Notes (Signed)
Safety precautions to be maintained throughout the outpatient stay will include: orient to surroundings, keep bed in low position, maintain call bell within reach at all times, provide assistance with transfer out of bed and ambulation.  

## 2016-05-28 ENCOUNTER — Encounter: Payer: Self-pay | Admitting: Pain Medicine

## 2016-05-28 ENCOUNTER — Ambulatory Visit: Payer: Medicare Other | Attending: Pain Medicine | Admitting: Pain Medicine

## 2016-05-28 VITALS — BP 121/78 | HR 63 | Temp 97.7°F | Resp 16 | Ht 72.0 in | Wt 220.0 lb

## 2016-05-28 DIAGNOSIS — M5030 Other cervical disc degeneration, unspecified cervical region: Secondary | ICD-10-CM | POA: Insufficient documentation

## 2016-05-28 DIAGNOSIS — M47812 Spondylosis without myelopathy or radiculopathy, cervical region: Secondary | ICD-10-CM

## 2016-05-28 DIAGNOSIS — R51 Headache: Secondary | ICD-10-CM | POA: Diagnosis present

## 2016-05-28 DIAGNOSIS — Z9889 Other specified postprocedural states: Secondary | ICD-10-CM

## 2016-05-28 DIAGNOSIS — M5481 Occipital neuralgia: Secondary | ICD-10-CM | POA: Insufficient documentation

## 2016-05-28 DIAGNOSIS — M5136 Other intervertebral disc degeneration, lumbar region: Secondary | ICD-10-CM | POA: Insufficient documentation

## 2016-05-28 DIAGNOSIS — M533 Sacrococcygeal disorders, not elsewhere classified: Secondary | ICD-10-CM | POA: Insufficient documentation

## 2016-05-28 DIAGNOSIS — M542 Cervicalgia: Secondary | ICD-10-CM | POA: Diagnosis present

## 2016-05-28 DIAGNOSIS — M47816 Spondylosis without myelopathy or radiculopathy, lumbar region: Secondary | ICD-10-CM

## 2016-05-28 MED ORDER — GABAPENTIN 300 MG PO CAPS: ORAL_CAPSULE | ORAL | 0 refills | Status: DC

## 2016-05-28 MED ORDER — HYDROCODONE-ACETAMINOPHEN 5-325 MG PO TABS: ORAL_TABLET | ORAL | 0 refills | Status: DC

## 2016-05-28 NOTE — Patient Instructions (Signed)
PLAN   Continue present medication hydrocodone acetaminophen and Neurontin..   F/U PCP Dr. Quillian Quince for evaluation of  BP and general medical  Condition as discussed  F/U surgical evaluation. May consider pending follow-up evaluations. Patient prefers to avoid surgical evaluation at this time  F/U neurological evaluation. May consider PNCV/EMG studies and other studies pending follow-up evaluations. We will avoid additional studies at this time  May consider radiofrequency rhizolysis or intraspinal procedures pending response to present treatment and F/U evaluation . We will avoid such procedures at this time  Patient to call Pain Management Center should patient have concerns prior to scheduled return appointment.

## 2016-05-28 NOTE — Progress Notes (Signed)
     The patient is a 65 year old gentleman who returns to pain management for further evaluation and treatment of pain involving the neck associated with headaches as well as pain involving the shoulders and upper back mid back lower back and lower extremity regions. The patient states that he is able to perform activities of daily living without experiencing severe disabling pain this time. The patient has been able to finish and perform of activities as well as completing chores without significant pain interfering with activities. The patient denies any recent trauma change in events of daily living the call significant change in symptomatology and states that he is tolerating Neurontin and hydrocodone acetaminophen without any undesirable side effects. We will continue presently prescribed medications and we will remain available to consider patient for additional modifications of treatment regimen pending response to treatment and follow-up evaluation. All agreed to suggested treatment plan    Physical examination  The patient was with tenderness to palpation of the paraspinal musculature in the cervical region cervical facet region palpation which reproduces pain of moderate degree with moderate tenderness of the splenius capitis and occipitalis regions. Palpation of the acromioclavicular and glenohumeral joint regions reproduced pain of mild-to-moderate degree. The patient was with moderate difficulty performing drop test. The patient appeared to be with unremarkable Spurling's maneuver. Palpation of the cervical facet cervical paraspinal musculature region was attends to palpation of moderate degree with moderate tenderness over the thoracic facet thoracic paraspinal musculature region. Palpation over the lumbar region and thoracic region was attends to palpation evidence of moderate muscle spasm. There was no crepitus of the thoracic region noted. Lateral bending rotation extension and palpation  of the lumbar facets reproduce mild to moderate discomfort. Straight leg raise was tolerates approximately 30 without increased pain with dorsiflexion noted. There was moderate tenderness of the PSIS and PII S region. No sensory deficit or dermatomal distribution was detected. Palpation of the greater trochanteric region iliotibial band region reproduced minimal discomfort. There was negative clonus negative Homans. Abdomen nontender with no costovertebral tenderness noted       Assessment   Degenerative disc disease lumbar spine Status post surgical intervention lumbar region  Lumbar facet syndrome  Sacroiliac joint dysfunction  Bilateral occipital neuralgia  Degenerative disc disease cervical spine  Cervical facet syndrome      PLAN   Continue present medications hydrocodone acetaminophen and Neurontin..   F/U PCP Dr. Quillian Quince for evaluation of  BP and general medical  Condition as discussed  F/U surgical evaluation. May consider pending follow-up evaluations. Patient prefers to avoid surgical evaluation at this time  F/U neurological evaluation. May consider PNCV/EMG studies and other studies pending follow-up evaluations. We will avoid additional studies at this time  May consider radiofrequency rhizolysis or intraspinal procedures pending response to present treatment and F/U evaluation . We will avoid such procedures at this time  Patient to call Pain Management Center should patient have concerns prior to scheduled return appointment.

## 2016-06-26 ENCOUNTER — Encounter: Payer: Self-pay | Admitting: Pain Medicine

## 2016-06-26 ENCOUNTER — Ambulatory Visit: Payer: Medicare Other | Attending: Pain Medicine | Admitting: Pain Medicine

## 2016-06-26 VITALS — BP 151/59 | HR 51 | Temp 97.8°F | Resp 16 | Ht 72.0 in | Wt 213.0 lb

## 2016-06-26 DIAGNOSIS — M5030 Other cervical disc degeneration, unspecified cervical region: Secondary | ICD-10-CM

## 2016-06-26 DIAGNOSIS — M5136 Other intervertebral disc degeneration, lumbar region: Secondary | ICD-10-CM | POA: Insufficient documentation

## 2016-06-26 DIAGNOSIS — M533 Sacrococcygeal disorders, not elsewhere classified: Secondary | ICD-10-CM

## 2016-06-26 DIAGNOSIS — Z9889 Other specified postprocedural states: Secondary | ICD-10-CM

## 2016-06-26 DIAGNOSIS — M5481 Occipital neuralgia: Secondary | ICD-10-CM | POA: Insufficient documentation

## 2016-06-26 DIAGNOSIS — M47816 Spondylosis without myelopathy or radiculopathy, lumbar region: Secondary | ICD-10-CM

## 2016-06-26 DIAGNOSIS — M542 Cervicalgia: Secondary | ICD-10-CM | POA: Diagnosis present

## 2016-06-26 DIAGNOSIS — R51 Headache: Secondary | ICD-10-CM | POA: Diagnosis present

## 2016-06-26 DIAGNOSIS — M51369 Other intervertebral disc degeneration, lumbar region without mention of lumbar back pain or lower extremity pain: Secondary | ICD-10-CM

## 2016-06-26 DIAGNOSIS — M47812 Spondylosis without myelopathy or radiculopathy, cervical region: Secondary | ICD-10-CM

## 2016-06-26 MED ORDER — GABAPENTIN 300 MG PO CAPS
ORAL_CAPSULE | ORAL | 0 refills | Status: DC
Start: 2016-06-26 — End: 2018-01-23

## 2016-06-26 MED ORDER — HYDROCODONE-ACETAMINOPHEN 5-325 MG PO TABS: ORAL_TABLET | ORAL | 0 refills | Status: AC

## 2016-06-26 NOTE — Progress Notes (Signed)
Safety precautions to be maintained throughout the outpatient stay will include: orient to surroundings, keep bed in low position, maintain call bell within reach at all times, provide assistance with transfer out of bed and ambulation.  

## 2016-06-26 NOTE — Progress Notes (Signed)
    The patient is a 65 year old gentleman who returns to pain management for further evaluation and treatment of pain involving the neck associated with headaches as well as pain of the upper mid lower back and lower extremity regions. Patient denies any trauma change in events of daily living the call significant change in symptomatology. The patient is able to perform most activities of daily living without experiencing in severely disabling pain. The patient is tolerating Neurontin and hydrocodone acetaminophen without any undesirable side effects. We will continue presently prescribed medications. We will consider interventional treatment should there be significant pain unresponsive to the noninterventional treatment measures. The patient was with understanding and agreed to suggested treatment plan. The patient is with prior surgery of the lumbar region and states that the hardware does interfere with his ability to perform certain maneuvers such as turning over when lying flat and other activities. The patient states overall he is doing very well at this time with the present regimen. We will continue medications of Neurontin and hydrocodone acetaminophen. All agreed to suggested treatment plan.     Physical examination  There was tenderness to palpation of the paraspinal musculature region of the cervical region cervical facet region palpation which reproduces mild discomfort. There was mild tenderness over the splenius capitis and occipitalis region. No masses of the head and neck were noted. Palpation of the acromioclavicular and glenohumeral joint regions reproduce mild discomfort and patient was with mild difficulty performing the drop test. The patient appeared to be with significant tenderness to palpation of the splenius capitis and occipitalis regions. The patient appeared to be with bilaterally equal grip strength. Tinel and Phalen's maneuver were without increased pain of significant  degree. Outpatient over the thoracic region was with tenderness to palpation with crepitus of the thoracic region noted. Lateral bending rotation extension and palpation of the lumbar facet region reproduced pain of mild-to-moderate degree. The patient had difficulty assuming the lateral decubitus position when lying in the supine position. There was increased pain with pressure applied to the ileum with patient in lateral decubitus position. Palpation of the PSIS and PII S region reproduced moderate discomfort with mild tenderness along the greater trochanteric region iliotibial band region. No definite sensory deficit or dermatomal distribution detected. There was negative clonus negative Homans. Knees were tenderness to palpation of moderate degree. Straight leg raise was tolerates approximately 20 without increased pain with dorsiflexion noted. EHL strength appeared to be slightly decreased. Abdomen was nontender with no costovertebral angle tenderness noted     Assessment   Degenerative disc disease lumbar spine Status post surgical intervention lumbar region  Lumbar facet syndrome  Sacroiliac joint dysfunction  Bilateral occipital neuralgia  Degenerative disc disease cervical spine     PLAN   Continue present medication hydrocodone acetaminophen and Neurontin..   F/U PCP Dr. Quillian QuinceBliss for evaluation of  BP and general medical condition as discussed  F/U surgical evaluation. May consider pending follow-up evaluations. Patient prefers to avoid surgical evaluation at this time  F/U neurological evaluation. May consider PNCV/EMG studies and other studies pending follow-up evaluations. We will avoid additional studies at this time  May consider radiofrequency rhizolysis or intraspinal procedures pending response to present treatment and F/U evaluation . We will avoid such procedures at this time  Patient to call Pain Management Center should patient have concerns prior to scheduled  return appointment.

## 2016-06-26 NOTE — Patient Instructions (Signed)
PLAN   Continue present medication hydrocodone acetaminophen and Neurontin..   F/U PCP Dr. Quillian QuinceBliss for evaluation of  BP and general medical condition as discussed  F/U surgical evaluation. May consider pending follow-up evaluations. Patient prefers to avoid surgical evaluation at this time  F/U neurological evaluation. May consider PNCV/EMG studies and other studies pending follow-up evaluations. We will avoid additional studies at this time  May consider radiofrequency rhizolysis or intraspinal procedures pending response to present treatment and F/U evaluation . We will avoid such procedures at this time  Patient to call Pain Management Center should patient have concerns prior to scheduled return appointment.

## 2016-07-10 ENCOUNTER — Telehealth: Payer: Self-pay | Admitting: *Deleted

## 2016-07-10 ENCOUNTER — Telehealth: Payer: Self-pay

## 2016-07-10 NOTE — Telephone Encounter (Signed)
Patient returned our call about cancelling his appt with DR. Crisp on 9-19. I gave him Dr. Eudelia Bunchrisps [place] office number and he is going to call there to make an appt.

## 2016-07-22 ENCOUNTER — Other Ambulatory Visit: Payer: Self-pay | Admitting: Pain Medicine

## 2016-07-23 ENCOUNTER — Other Ambulatory Visit: Payer: Self-pay | Admitting: Pain Medicine

## 2016-07-24 ENCOUNTER — Ambulatory Visit: Payer: Medicare Other | Admitting: Pain Medicine

## 2016-11-19 ENCOUNTER — Other Ambulatory Visit: Payer: Self-pay | Admitting: Pain Medicine

## 2017-07-30 ENCOUNTER — Other Ambulatory Visit: Payer: Self-pay | Admitting: Pain Medicine

## 2017-07-30 DIAGNOSIS — M5136 Other intervertebral disc degeneration, lumbar region: Secondary | ICD-10-CM

## 2017-07-30 DIAGNOSIS — M48062 Spinal stenosis, lumbar region with neurogenic claudication: Secondary | ICD-10-CM

## 2017-07-30 DIAGNOSIS — M5416 Radiculopathy, lumbar region: Secondary | ICD-10-CM

## 2017-08-01 ENCOUNTER — Ambulatory Visit
Admission: RE | Admit: 2017-08-01 | Discharge: 2017-08-01 | Disposition: A | Discharge status: Home / Self Care | Payer: Medicare HMO | Source: Ambulatory Visit | Attending: Pain Medicine | Admitting: Pain Medicine

## 2017-08-07 ENCOUNTER — Ambulatory Visit
Admission: RE | Admit: 2017-08-07 | Discharge: 2017-08-07 | Disposition: A | Discharge status: Home / Self Care | Payer: Medicare HMO | Source: Ambulatory Visit | Attending: Pain Medicine | Admitting: Pain Medicine

## 2017-08-07 DIAGNOSIS — M48062 Spinal stenosis, lumbar region with neurogenic claudication: Secondary | ICD-10-CM | POA: Insufficient documentation

## 2017-08-07 DIAGNOSIS — M1288 Other specific arthropathies, not elsewhere classified, other specified site: Secondary | ICD-10-CM | POA: Diagnosis not present

## 2017-08-07 DIAGNOSIS — M48061 Spinal stenosis, lumbar region without neurogenic claudication: Secondary | ICD-10-CM | POA: Insufficient documentation

## 2017-08-07 DIAGNOSIS — M5136 Other intervertebral disc degeneration, lumbar region: Secondary | ICD-10-CM | POA: Diagnosis not present

## 2017-08-07 DIAGNOSIS — M5416 Radiculopathy, lumbar region: Secondary | ICD-10-CM | POA: Insufficient documentation

## 2018-01-22 ENCOUNTER — Other Ambulatory Visit (HOSPITAL_COMMUNITY): Payer: Self-pay | Admitting: Neurosurgery

## 2018-01-22 ENCOUNTER — Other Ambulatory Visit: Payer: Self-pay | Admitting: Neurosurgery

## 2018-01-22 DIAGNOSIS — M961 Postlaminectomy syndrome, not elsewhere classified: Secondary | ICD-10-CM

## 2018-02-04 ENCOUNTER — Ambulatory Visit (HOSPITAL_COMMUNITY)
Admission: RE | Admit: 2018-02-04 | Discharge: 2018-02-04 | Disposition: A | Discharge status: Home / Self Care | Payer: Medicare Other | Source: Ambulatory Visit | Attending: Neurosurgery | Admitting: Neurosurgery

## 2018-02-04 DIAGNOSIS — I700 Atherosclerosis of aorta: Secondary | ICD-10-CM | POA: Insufficient documentation

## 2018-02-04 DIAGNOSIS — M48061 Spinal stenosis, lumbar region without neurogenic claudication: Secondary | ICD-10-CM | POA: Diagnosis not present

## 2018-02-04 DIAGNOSIS — M961 Postlaminectomy syndrome, not elsewhere classified: Secondary | ICD-10-CM

## 2018-02-04 DIAGNOSIS — M4316 Spondylolisthesis, lumbar region: Secondary | ICD-10-CM | POA: Diagnosis not present

## 2018-02-04 DIAGNOSIS — M545 Low back pain: Secondary | ICD-10-CM | POA: Insufficient documentation

## 2018-02-04 DIAGNOSIS — M5126 Other intervertebral disc displacement, lumbar region: Secondary | ICD-10-CM | POA: Insufficient documentation

## 2018-02-04 MED ORDER — LIDOCAINE HCL (PF) 1 % IJ SOLN
5.0000 mL | Freq: Once | INTRAMUSCULAR | Status: AC
  Administered 2018-02-04: 5 mL via INTRADERMAL

## 2018-02-04 MED ORDER — OXYCODONE HCL 5 MG PO TABS: 5.0000 mg | ORAL_TABLET | ORAL | Status: DC | PRN

## 2018-02-04 MED ORDER — ONDANSETRON HCL 4 MG/2ML IJ SOLN: 4.0000 mg | Freq: Four times a day (QID) | INTRAMUSCULAR | Status: DC | PRN

## 2018-02-04 MED ORDER — DIAZEPAM 5 MG PO TABS
10.0000 mg | ORAL_TABLET | Freq: Once | ORAL | Status: AC
  Administered 2018-02-04: 10 mg via ORAL
  Filled 2018-02-04: qty 2

## 2018-02-04 MED ORDER — IOPAMIDOL (ISOVUE-M 200) INJECTION 41%
INTRAMUSCULAR | Status: AC
  Administered 2018-02-04: 20 mL via INTRA_ARTICULAR
  Filled 2018-02-04: qty 10

## 2018-02-04 MED ORDER — IOPAMIDOL (ISOVUE-M 200) INJECTION 41%
20.0000 mL | Freq: Once | INTRAMUSCULAR | Status: AC
  Administered 2018-02-04: 20 mL via INTRA_ARTICULAR

## 2018-02-04 MED ORDER — LIDOCAINE HCL (PF) 1 % IJ SOLN
INTRAMUSCULAR | Status: AC
  Administered 2018-02-04: 5 mL via INTRADERMAL
  Filled 2018-02-04: qty 5

## 2018-02-04 MED ORDER — DIAZEPAM 5 MG PO TABS
ORAL_TABLET | ORAL | Status: AC
  Administered 2018-02-04: 10 mg via ORAL
  Filled 2018-02-04: qty 2

## 2018-02-04 NOTE — Op Note (Signed)
02/04/2018 Lumbar Myelogram  PATIENT:  Troy Woodward is a 67 y.o. male  PRE-OPERATIVE DIAGNOSIS:  Lumbago, failed back syndrome  POST-OPERATIVE DIAGNOSIS:  same  PROCEDURE:  Lumbar Myelogram  SURGEON:  Cristofer Yaffe  ANESTHESIA:   local LOCAL MEDICATIONS USED:  LIDOCAINE  and Amount: 5 ml Procedure Note: Troy Woodward is a 67 y.o. male Was taken to the fluoroscopy suite and  positioned prone on the fluoroscopy table. His back was prepared and draped in a sterile manner. I infiltrated 5 cc into the lumbar region. I then introduced a spinal needle into the thecal sac at the L5/s1 interlaminar space. I infiltrated 20cc of Isovue 200 into the thecal sac. Fluoroscopy showed the needle and contrast in the thecal sac. Troy Woodward tolerated the procedure well. he Will be taken to CT for evaluation.     PATIENT DISPOSITION:  PACU - hemodynamically stable.

## 2018-02-04 NOTE — Discharge Instructions (Signed)
**Note Troy Woodward-identified via Obfuscation** Myelogram, Care After °These instructions give you information about caring for yourself after your procedure. Your doctor may also give you more specific instructions. Call your doctor if you have any problems or questions after your procedure. °Follow these instructions at home: °· Drink enough fluid to keep your pee (urine) clear or pale yellow. °· Rest as told by your doctor. °· Lie flat with your head slightly raised (elevated). °· Do not bend, lift, or do any hard activities for 24-48 hours or as told by your doctor. °· Take over-the-counter and prescription medicines only as told by your doctor. °· Take care of and remove your bandage (dressing) as told by your doctor. °· Bathe or shower as told by your doctor. °Contact a health care provider if: °· You have a fever. °· You have a headache that lasts longer than 24 hours. °· You feel sick to your stomach (nauseous). °· You throw up (vomit). °· Your neck is stiff. °· Your legs feel numb. °· You cannot pee. °· You cannot poop (have a bowel movement). °· You have a rash. °· You are itchy or sneezing. °Get help right away if: °· You have new symptoms or your symptoms get worse. °· You have a seizure. °· You have trouble breathing. °This information is not intended to replace advice given to you by your health care provider. Make sure you discuss any questions you have with your health care provider. °Document Released: 07/31/2008 Document Revised: 06/21/2016 Document Reviewed: 08/04/2015 °Elsevier Interactive Patient Education © 2018 [employer] ° °

## 2018-09-09 ENCOUNTER — Other Ambulatory Visit: Payer: Self-pay | Admitting: Neurosurgery

## 2018-09-09 DIAGNOSIS — M4722 Other spondylosis with radiculopathy, cervical region: Secondary | ICD-10-CM

## 2018-09-25 ENCOUNTER — Ambulatory Visit
Admission: RE | Admit: 2018-09-25 | Discharge: 2018-09-25 | Disposition: A | Discharge status: Home / Self Care | Payer: Medicare Other | Source: Ambulatory Visit | Attending: Neurosurgery | Admitting: Neurosurgery

## 2018-09-25 DIAGNOSIS — M50222 Other cervical disc displacement at C5-C6 level: Secondary | ICD-10-CM | POA: Diagnosis not present

## 2018-09-25 DIAGNOSIS — M1288 Other specific arthropathies, not elsewhere classified, other specified site: Secondary | ICD-10-CM | POA: Diagnosis not present

## 2018-09-25 DIAGNOSIS — M4802 Spinal stenosis, cervical region: Secondary | ICD-10-CM | POA: Insufficient documentation

## 2018-09-25 DIAGNOSIS — M4722 Other spondylosis with radiculopathy, cervical region: Secondary | ICD-10-CM | POA: Insufficient documentation

## 2018-09-25 DIAGNOSIS — G9589 Other specified diseases of spinal cord: Secondary | ICD-10-CM | POA: Insufficient documentation

## 2019-09-22 ENCOUNTER — Other Ambulatory Visit: Payer: Self-pay

## 2019-09-22 DIAGNOSIS — Z20822 Contact with and (suspected) exposure to covid-19: Secondary | ICD-10-CM

## 2019-09-23 LAB — NOVEL CORONAVIRUS, NAA: SARS-CoV-2, NAA: NOT DETECTED

## 2019-12-23 ENCOUNTER — Other Ambulatory Visit: Payer: Self-pay | Admitting: Physical Medicine and Rehabilitation

## 2019-12-23 DIAGNOSIS — M5412 Radiculopathy, cervical region: Secondary | ICD-10-CM

## 2020-01-05 ENCOUNTER — Ambulatory Visit
Admission: RE | Admit: 2020-01-05 | Discharge: 2020-01-05 | Disposition: A | Discharge status: Home / Self Care | Payer: Medicare Other | Source: Ambulatory Visit | Attending: Physical Medicine and Rehabilitation | Admitting: Physical Medicine and Rehabilitation

## 2020-01-05 ENCOUNTER — Other Ambulatory Visit: Payer: Self-pay

## 2020-01-05 DIAGNOSIS — M5412 Radiculopathy, cervical region: Secondary | ICD-10-CM | POA: Diagnosis present

## 2021-07-20 IMAGING — MR MR CERVICAL SPINE W/O CM
5 series · 38 of 48 positions shown · non-contrast
Comparison: 09/25/2018

CLINICAL DATA: Chronic constant neck pain, right arm numbness

EXAM:
MRI CERVICAL SPINE WITHOUT CONTRAST
TECHNIQUE: Multiplanar, multisequence MR imaging of the cervical spine was
performed. No intravenous contrast was administered.

[Series 5: T2 · sagittal · 3.0mm · 0.62mm/px · 6 of 15 slices shown (1 of 2)]
[im 1/15]
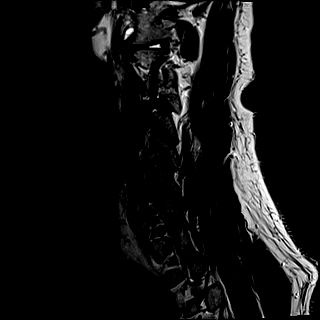
[im 3/15]
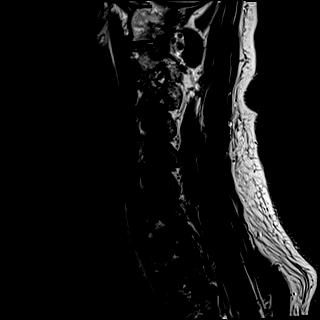
[im 6/15]
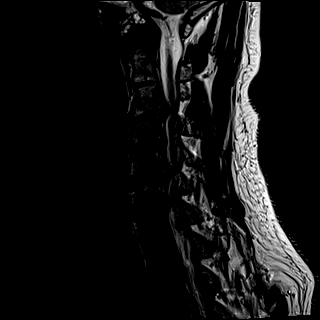
[im 9/15]
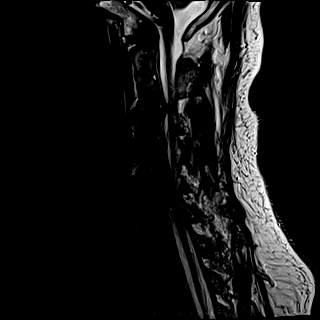
[im 12/15]
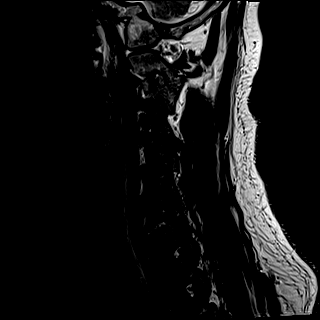
[im 15/15]
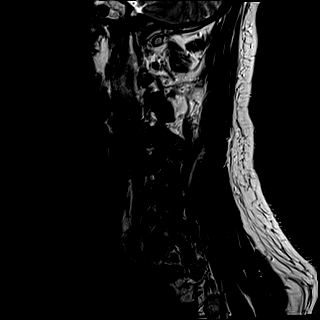

[Series 6: FLAIR · sagittal · 3.0mm · 0.78mm/px · 7 of 15 slices shown]
[im 1/15]
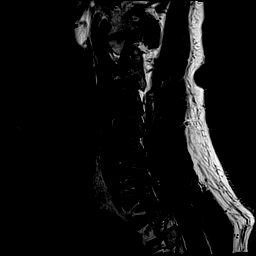
[im 3/15]
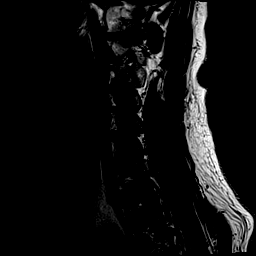
[im 5/15]
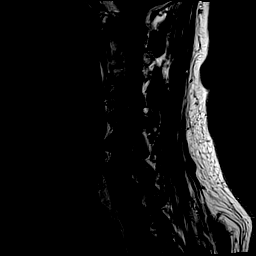
[im 8/15]
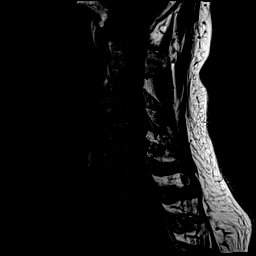
[im 10/15]
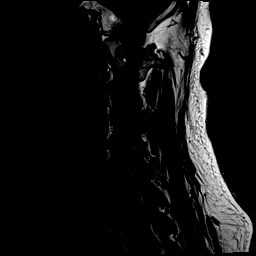
[im 12/15]
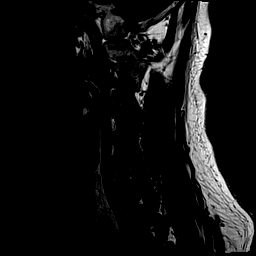
[im 15/15]
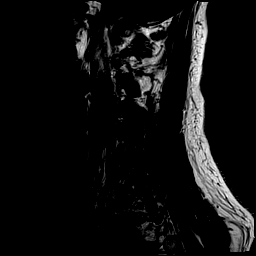

[Series 7: STIR · sagittal · 3.0mm · 0.62mm/px · 7 of 15 slices shown]
[im 1/15]
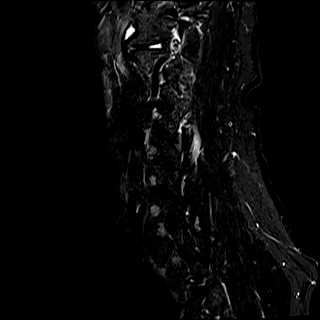
[im 3/15]
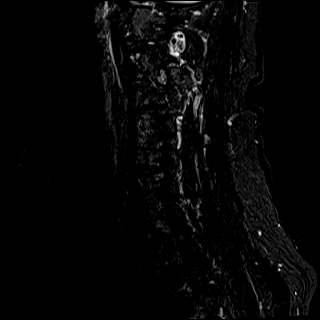
[im 5/15]
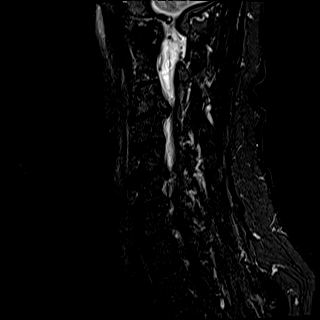
[im 8/15]
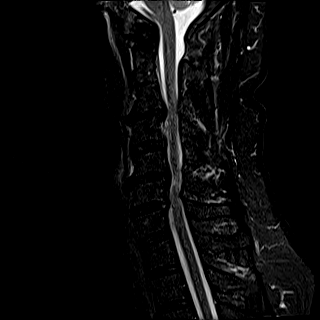
[im 10/15]
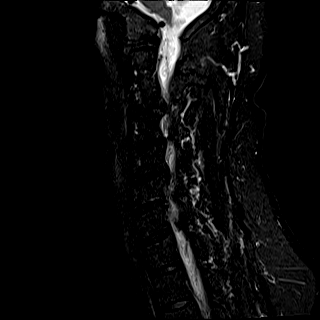
[im 12/15]
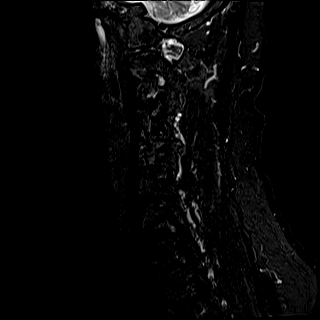
[im 15/15]
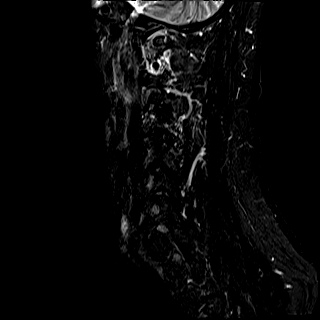

[Series 9: T2 · axial · 3.0mm · 0.70mm/px · z∈[-133,-27]mm · 10 of 32 slices shown (2 of 2)]
[im 1/32]
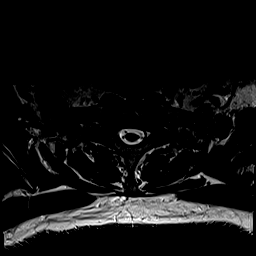
[im 3/32]
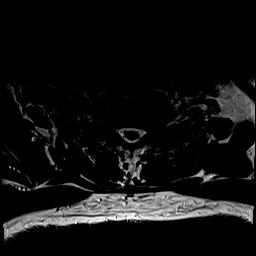
[im 5/32]
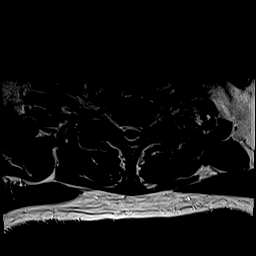
[im 8/32]
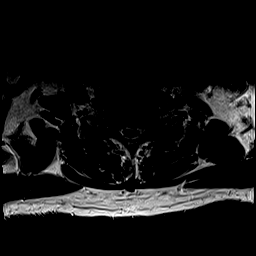
[im 10/32]
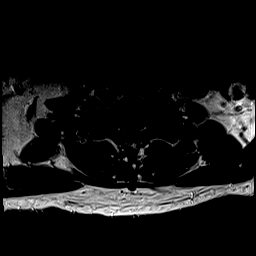
[im 15/32]
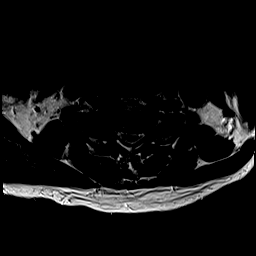
[im 17/32]
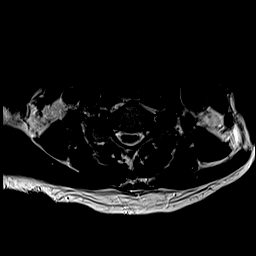
[im 22/32]
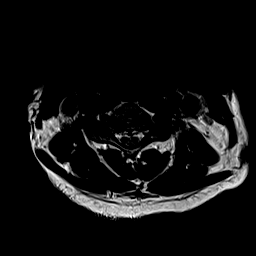
[im 27/32]
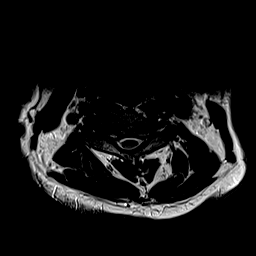
[im 32/32]
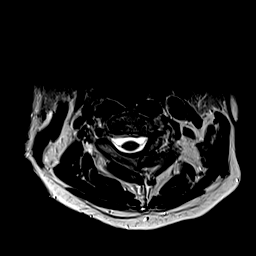

[Series 10: ax mpgr · axial · 3.0mm · 0.35mm/px · z∈[-133,-27]mm · 8 of 32 slices shown]
[im 1/32]
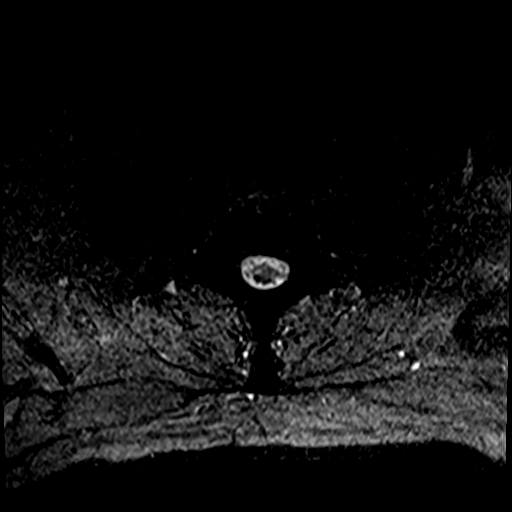
[im 5/32]
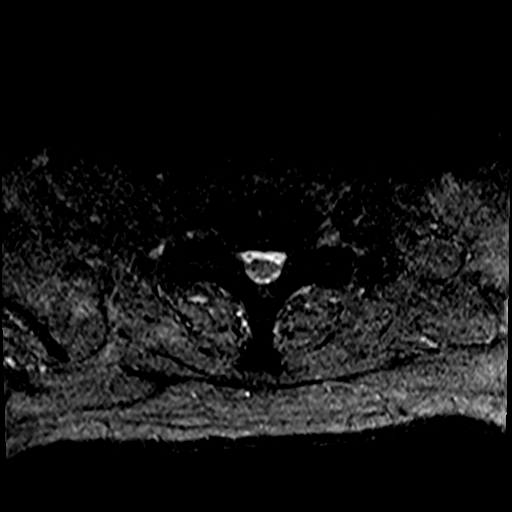
[im 10/32]
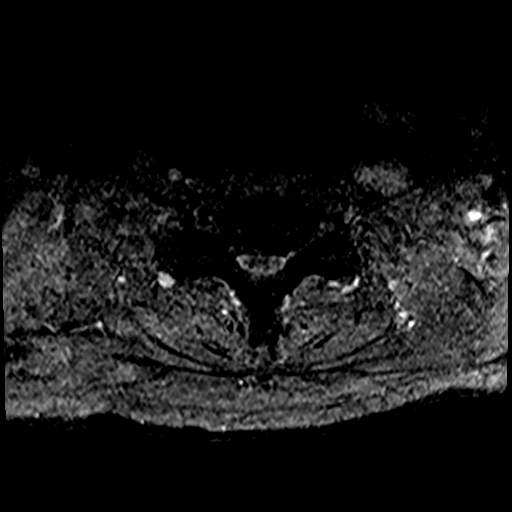
[im 15/32]
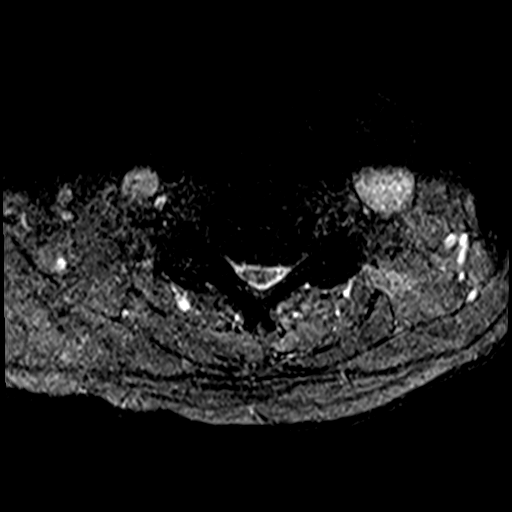
[im 17/32]
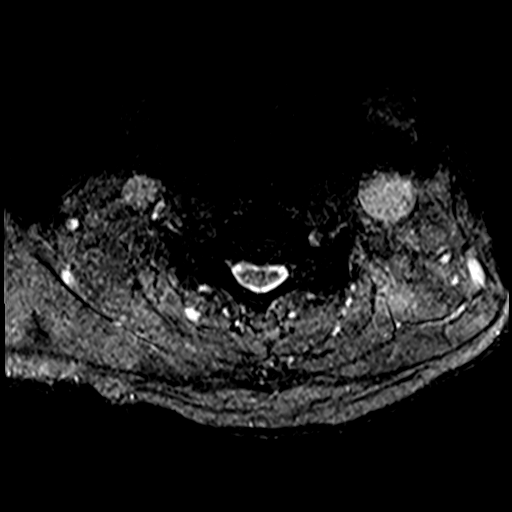
[im 22/32]
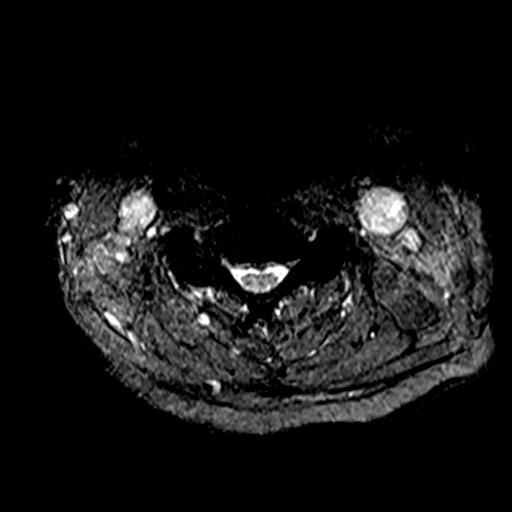
[im 27/32]
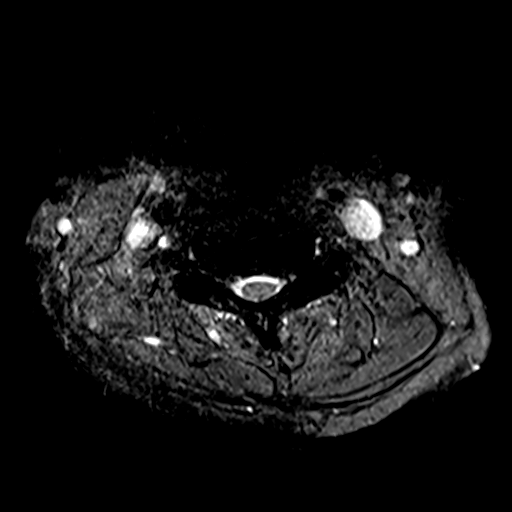
[im 32/32]
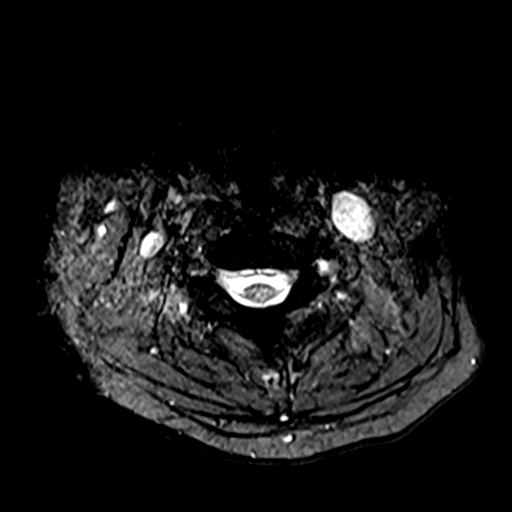

[38 of 48 positions shown; findings below may reference images not displayed]

FINDINGS: Alignment: Physiologic.

Vertebrae: No fracture, evidence of discitis, or bone lesion.

Cord: Mild edema in the cervical spinal cord at the level of C5-6
with mild volume loss consistent with myelomalacia.

Posterior Fossa, vertebral arteries, paraspinal tissues: Posterior
fossa demonstrates no focal abnormality. Vertebral artery flow voids
are maintained. Paraspinal soft tissues are unremarkable.

Disc levels:

Discs: Congenital fusion of the C2-3 vertebral bodies and posterior
elements.

C2-3: Congenital fusion. No foraminal narrowing. No central canal
stenosis.

C3-4: Large broad-based disc osteophyte complex. Severe spinal
stenosis. Bilateral uncovertebral degenerative changes. Severe
bilateral foraminal stenosis.

C4-5: Minimal broad-based disc bulge. Moderate bilateral facet
arthropathy. Bilateral uncovertebral degenerative changes. Severe
bilateral foraminal stenosis.

C5-6: Broad-based disc bulge. Severe spinal stenosis. Mild bilateral
facet arthropathy. Bilateral uncovertebral degenerative changes.
Severe bilateral foraminal stenosis.

C6-7: Broad-based disc bulge with a right foraminal disc protrusion.
Mild bilateral facet arthropathy. Severe bilateral foraminal
stenosis. Moderate spinal stenosis.

C7-T1: No significant disc bulge. No neural foraminal stenosis. No
central canal stenosis. Mild bilateral facet arthropathy.
IMPRESSION: Diffuse cervical spine spondylosis as described above similar in
appearance to the prior examination of 09/25/2018.

Mild stable myelomalacia of the cervical spinal cord at C5-6.
